# Patient Record
Sex: Male | Born: 1965 | Race: White | Hispanic: No | Marital: Married | Smoking: Never smoker
Health system: Southern US, Community
[De-identification: ages and names within clinical notes are randomized; demographics above are authoritative.]

## PROBLEM LIST (undated history)

## (undated) DIAGNOSIS — E7800 Pure hypercholesterolemia, unspecified: Secondary | ICD-10-CM

## (undated) DIAGNOSIS — E079 Disorder of thyroid, unspecified: Secondary | ICD-10-CM

---

## 2010-06-30 ENCOUNTER — Emergency Department (HOSPITAL_COMMUNITY)
Admission: EM | Admit: 2010-06-30 | Discharge: 2010-06-30 | Disposition: A | Discharge status: Home / Self Care | Payer: BC Managed Care – PPO | Attending: Emergency Medicine | Admitting: Emergency Medicine

## 2010-06-30 DIAGNOSIS — E785 Hyperlipidemia, unspecified: Secondary | ICD-10-CM | POA: Insufficient documentation

## 2010-06-30 DIAGNOSIS — Z79899 Other long term (current) drug therapy: Secondary | ICD-10-CM | POA: Insufficient documentation

## 2010-06-30 DIAGNOSIS — G500 Trigeminal neuralgia: Secondary | ICD-10-CM | POA: Insufficient documentation

## 2010-06-30 DIAGNOSIS — E039 Hypothyroidism, unspecified: Secondary | ICD-10-CM | POA: Insufficient documentation

## 2010-06-30 DIAGNOSIS — R51 Headache: Secondary | ICD-10-CM | POA: Insufficient documentation

## 2010-07-07 ENCOUNTER — Other Ambulatory Visit: Payer: Self-pay | Admitting: Family Medicine

## 2014-04-28 ENCOUNTER — Emergency Department (HOSPITAL_COMMUNITY)
Admission: EM | Admit: 2014-04-28 | Discharge: 2014-04-28 | Disposition: A | Discharge status: Home / Self Care | Payer: No Typology Code available for payment source | Attending: Emergency Medicine | Admitting: Emergency Medicine

## 2014-04-28 ENCOUNTER — Emergency Department (HOSPITAL_COMMUNITY): Payer: No Typology Code available for payment source

## 2014-04-28 ENCOUNTER — Encounter (HOSPITAL_COMMUNITY): Payer: Self-pay | Admitting: Emergency Medicine

## 2014-04-28 DIAGNOSIS — Y9389 Activity, other specified: Secondary | ICD-10-CM | POA: Insufficient documentation

## 2014-04-28 DIAGNOSIS — S0181XA Laceration without foreign body of other part of head, initial encounter: Secondary | ICD-10-CM | POA: Insufficient documentation

## 2014-04-28 DIAGNOSIS — S6991XA Unspecified injury of right wrist, hand and finger(s), initial encounter: Secondary | ICD-10-CM | POA: Diagnosis present

## 2014-04-28 DIAGNOSIS — S6391XA Sprain of unspecified part of right wrist and hand, initial encounter: Secondary | ICD-10-CM

## 2014-04-28 DIAGNOSIS — Z8639 Personal history of other endocrine, nutritional and metabolic disease: Secondary | ICD-10-CM | POA: Diagnosis not present

## 2014-04-28 DIAGNOSIS — Y92410 Unspecified street and highway as the place of occurrence of the external cause: Secondary | ICD-10-CM | POA: Insufficient documentation

## 2014-04-28 DIAGNOSIS — S134XXA Sprain of ligaments of cervical spine, initial encounter: Secondary | ICD-10-CM | POA: Diagnosis not present

## 2014-04-28 DIAGNOSIS — S43401A Unspecified sprain of right shoulder joint, initial encounter: Secondary | ICD-10-CM

## 2014-04-28 DIAGNOSIS — Y998 Other external cause status: Secondary | ICD-10-CM | POA: Diagnosis not present

## 2014-04-28 HISTORY — DX: Disorder of thyroid, unspecified: E07.9

## 2014-04-28 HISTORY — DX: Pure hypercholesterolemia, unspecified: E78.00

## 2014-04-28 MED ORDER — IBUPROFEN 800 MG PO TABS: 800.0000 mg | ORAL_TABLET | Freq: Three times a day (TID) | ORAL | Status: AC

## 2014-04-28 MED ORDER — METHOCARBAMOL 500 MG PO TABS: 500.0000 mg | ORAL_TABLET | Freq: Two times a day (BID) | ORAL | Status: AC

## 2014-04-28 MED ORDER — HYDROCODONE-ACETAMINOPHEN 5-325 MG PO TABS: 1.0000 | ORAL_TABLET | Freq: Four times a day (QID) | ORAL | Status: AC | PRN

## 2014-04-28 MED ORDER — OXYCODONE-ACETAMINOPHEN 5-325 MG PO TABS
1.0000 | ORAL_TABLET | Freq: Once | ORAL | Status: AC
  Administered 2014-04-28: 1 via ORAL
  Filled 2014-04-28: qty 1

## 2014-04-28 MED ORDER — SODIUM CHLORIDE 0.9 % IV BOLUS (SEPSIS)
1000.0000 mL | Freq: Once | INTRAVENOUS | Status: AC
  Administered 2014-04-28: 1000 mL via INTRAVENOUS

## 2014-04-28 MED ORDER — ONDANSETRON HCL 4 MG/2ML IJ SOLN
4.0000 mg | Freq: Once | INTRAMUSCULAR | Status: AC
  Administered 2014-04-28: 4 mg via INTRAVENOUS
  Filled 2014-04-28: qty 2
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
## 2014-04-28 NOTE — ED Notes (Signed)
Pt has a ride home.  

## 2014-04-28 NOTE — Discharge Instructions (Signed)
Colises de veculos motorizados Academic librarian)  comum ter mltiplos ferimentos e dores musculares aps uma coliso de veculo motorizados (CVM). Eles tendem a ser piores nas primeiras 24 horas. Voc pode ter a maior parte de rigidez e sensibilidade nas primeiras horas. Voc tambm pode sentir-se pior quando ao acordar na primeira manh aps a coliso. Depois deste ponto, em geral voc comea a melhorar a cada dia. A velocidade na melhora frequentemente depende da gravidade da coliso, do nmero de ferimentos e do local e natureza desses ferimentos. INSTRUES PARA TRATAMENTO DOMICILIAR   Coloque gelo na rea afetada.  Coloque gelo em uma sacola plstica.  Coloque uma toalha entre a pele e a sacola.  Deixe o gelo por 15 a 20 minutos, 3 a 4 vezes por dia ou conforme orientado pelo seu mdico.  Beba lquidos em quantidade suficiente para manter sua urina clara ou com cor amarelo plida. No ingira bebidas alcolicas.  Tome um banho quente uma ou duas vezes por dia. Isso ir aumentar o fluxo sanguneo para os msculos doloridos.  Voc pode retornar a suas atividades conforme orientado pelo seu mdico. Tome cuidado ao carregar peso, pois isso pode agravar a dor no pescoo e nas costas.  Somente tome medicamentos de venda livre ou prescritos contra dor, desconforto ou febre conforme orientado pelo seu mdico. No use aspirina. Ela pode aumentar a contuso e o sangramento. PROCURE UM MDICO IMEDIATAMENTE SE:  Tiver dormncia, formigamento ou fraqueza nos braos ou pernas.  Tiver dor de cabea intensa no aliviada com medicamentos.  Tiver dor intensa no pescoo, especialmente sensibilidade no meio da parte de trs do pescoo.  Tiver alteraes no controle intestinal ou urinrio.  Tiver dor crescente em qualquer rea do corpo.  Tiver falta de ar, tontura, vertigem ou desmaiar.  Tiver dor no peito.  Sentir enjoos (nuseas), vomitar (vmitos) ou sudorese.  Sentir desconforto  abdominal crescente.  Houver sangue na urina, fezes ou vmito.  Tiver dores nos ombros (reas nas juntas dos ombros).  Sentir que seus sintomas pioraram. CERTIFIQUE-SE DE:   Compreender estas instrues.  Observar o seu estado de sade.  Procurar um mdico imediatamente se no se sentir bem ou piorar. Document Released: 12/06/2008 Document Revised: 06/26/2013 [facility] Patient Information 2015 [place], [place]. This information is not intended to replace advice given to you by your health care provider. Make sure you discuss any questions you have with your health care provider.  Deslocamento cervical (Cervical Sprain) Um deslocamento cervical  uma leso no pescoo na qual os tecidos fibrosos fortes (ligamentos) que conectam os ossos do pescoo alongam-se at romper-se. Deslocamentos cervicais podem variar de leves a graves. Os deslocamentos cervicais podem fazer com que a vrtebra do pescoo fique instvel. Isto pode levar a um dano na medula espinhal e pode resultar em problemas srios do sistema nervoso. O perodo de tempo necessrio para que um deslocamento cervical melhore depende da causa e Systems analyst. A maioria dos deslocamentos cervicais so curados de 1 a 3 semanas. CAUSAS  Deslocamentos cervicais severos podem ser causados por:   Leses por esportes de contato (como futebol, rgbi, lutas, hquei, automobilismo, ginstica, mergulho, artes [employer] boxe).  Colises de veculos motorizados.  Leso cervical "efeito chicote".  uma leso pelo movimento de deslocamento repentino para frente e para trs da cabea e pescoo.  Quedas. Deslocamentos cervicais leves podem ser causados por:   Estar em posies desajeitadas, como segurar um telefone entre a orelha e o ombro.  Sentar em [employer] cadeira  que no oferece apoio apropriado.  Trabalhar numa estao de computador mal projetada.  Olhar para cima ou para baixo durante longos perodos de tempo. SINTOMAS   Dor,  inflamao, ou uma sensao de [place] na parte da frente, de trs ou laterais do pescoo. Esse desconforto pode se desenvolver imediatamente aps a leso ou lentamente, 24 horas ou mais aps a leso.  Dor ou sensibilidade diretamente no meio da parte de trs do pescoo.  Dor no ombro ou na parte superior [address]  Capacidade limitada de mover o pescoo.  Dor de cabea.  Tontura.  Garnette Czech, ou formigamento nas mos ou braos.  Espasmos musculares.  Dificuldade para Arts development officer de sensibilidade e inchao do pescoo. DIAGNSTICO  Na maioria das vezes, seu mdico pode diagnosticar um deslocamento cervical tomando seu histrico e realizando um exame fsico. Seu mdico perguntar sobre leses prvias e quaisquer problemas conhecidos no pescoo, como artrite no pescoo. Podem ser feitas radiografias para descobrir se h quaisquer outros problemas, como com os ossos do pescoo. Outros exames, como uma tomografia computadorizada (TC) ou uma ressonncia magntica (RM) tambm podem ser necessrios.  TRATAMENTO  O tratamento depende da gravidade do deslocamento cervical. Deslocamentos cervicais leves podem ser tratados com repouso, mantendo o pescoo no lugar(imobilizao) e com medicamentos para a dor. Deslocamentos cervicais graves so imobilizados imediatamente. O tratamento adicional  realizado para auxiliar com a dor, os espasmos musculares e outros sintomas e podem incluir:  Medicamentos, como aliviadores da dor, anestsicos, ou relaxantes musculares.  Fisioterapia. Isto pode incluir exerccios de alongamento, fortalecimento e orientao de postura. Exerccios e melhoria da postura podem ajudar a estabilizar o pescoo, fortalecer os msculos e ajudar a fazer com que os sintomas no retornem. INSTRUES PARA TRATAMENTO DOMICILIAR   Coloque gelo na rea afetada.  Coloque gelo em uma sacola plstica.  Coloque uma toalha entre a pele e a sacola.  Deixe o gelo por  15 a 20 minutos, de 3 a 4 vezes ao dia.  Se a leso for grave, voc pode receber um colar cervical para vestir. Um colar cervical  um colar de duas peas projetado para impedir a movimentao do pescoo enquanto ele  curado.  No remova o colar a menos que seja orientado pelo mdico.  Se tiver cabelos longos, mantenha-os fora do colar.  Consulte o mdico antes de fazer ajustes no colar. Ajustes mnimos podem ser necessrios ao longo do tempo para melhorar o conforto e reduzir a presso no queixo ou na parte posterior da cabea.  Sefor autorizado a remover o colar para limpeza ou banho, siga as orientaes do mdico [employer] faz-lo com segurana.  Mantenha seu colar limpo, lavando-o com sabo leve e gua e secando-o completamente. Se o colar recebido tiver almofadas removveis, remova-as a cada 1 ou 2 dias e lave-as  mo com gua e sabo. Deixe-as secar ao ar livre. Elas devem estar completamente secos antes de serem usadas no colar.  Se for autorizado a remover o colar para limpeza e banho, lave e seque a pele do pescoo. Verifique se h irritao ou feridas na pele. Se vir alguma delas, conte ao mdico.  No dirija enquanto usar o colar.  Somente tome medicamentos vendidos com ou sem receita mdica para dor, desconforto ou febre, conforme indicao do seu mdico.  Comparea a todas as consultas de acompanhamento, conforme orientado pelo mdico.  Comparea a todas as consultas fisioterapia, conforme orientado pelo mdico.  Faa quaisquer ajustes necessrios  sua estao de trabalho  para promover Animatoruma boa postura.  Evite posies e atividades que tornem seus sintomas piores.  Faa aquecimento e se alongue antes de se exercitar para ajudar a Education officer, communityprevenir problemas. PROCURE UM MDICO SE:   O medicamento no ajudar a Surveyor, mineralscontrolar sua dor.  No for possvel diminuir o medicamento contra dor ao longo do perodo planejado.  O nvel de atividade no aumentar conforme esperado. PROCURE UM  MDICO IMEDIATAMENTE SE:   Desenvolver qualquer sangramento.  Desenvolver desconforto estomacal.  Tiver sinais de uma reao alrgica ao medicamento.  Seus problemas piorarem.  Desenvolver sintomas novos, inexplicados.  Tiver dormncia, formigamento, fraqueza ou paralisia em qualquer parte do corpo. CERTIFIQUE-SE DE:   Compreender estas instrues.  Observar as suas condies.  Procurar um mdico imediatamente se no se sentir bem ou piorar. Document Released: 12/06/2008 Document Revised: 11/30/2012 [facility] Patient Information 2015 [place], [place]. This information is not intended to replace advice given to you by your health care provider. Make sure you discuss any questions you have with your health care provider.
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
## 2014-04-28 NOTE — ED Notes (Signed)
Pt able to walk around. Denies dizziness, lightheadedness.

## 2014-06-14 ENCOUNTER — Ambulatory Visit: Payer: No Typology Code available for payment source | Attending: Orthopaedic Surgery | Admitting: Physical Therapy

## 2014-06-14 DIAGNOSIS — M542 Cervicalgia: Secondary | ICD-10-CM | POA: Diagnosis not present

## 2014-06-14 DIAGNOSIS — R29898 Other symptoms and signs involving the musculoskeletal system: Secondary | ICD-10-CM
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
## 2014-06-21 ENCOUNTER — Ambulatory Visit: Payer: No Typology Code available for payment source | Admitting: Rehabilitation

## 2014-06-21 DIAGNOSIS — M542 Cervicalgia: Secondary | ICD-10-CM | POA: Diagnosis not present

## 2014-06-21 DIAGNOSIS — R29898 Other symptoms and signs involving the musculoskeletal system: Secondary | ICD-10-CM

## 2014-06-21 NOTE — Therapy (Signed)
[facility] [address]  [address] [place], [place], [address] Phone: [phone]   Fax:  [phone]  Physical Therapy Treatment  Patient Details  Name: Seth CoderGuilherme Valentine MRN: [id number] Date of Birth: 02/03/1966 Referring Provider:  Eldred MangesYates, Mark C, MD  Encounter Date: 06/21/2014      PT End of Session - 06/21/14 1109    Visit Number 2   Number of Visits 12   Date for PT Re-Evaluation 07/26/14   PT Start Time 1105   PT Stop Time 1146   PT Time Calculation (min) 41 min      Past Medical History  Diagnosis Date  . High cholesterol   . Thyroid disease     No past surgical history on file.  There were no vitals filed for this visit.  Visit Diagnosis:  Neck pain  Upper extremity weakness      Subjective Assessment - 06/21/14 1107    Subjective Reports he is feeling better and better. States he only has pain/tightness towards the end of the day now. Still has weakness on Rt grip but pain on Lt hand/thumb.    Currently in Pain? Yes   Pain Score 3    Pain Location Hand   Pain Orientation Left                         [facility] Adult PT Treatment/Exercise - 06/21/14 1110    Exercises   Exercises Neck;Shoulder;Wrist   Shoulder Exercises: Standing   External Rotation Right;10 reps;Theraband   Theraband Level (Shoulder External Rotation) Level 3 (Green)   Internal Rotation Right;10 reps;Theraband  Notes some Lt UT tightness   Theraband Level (Shoulder Internal Rotation) Level 3 (Green)   Extension Both;10 reps;Theraband   Theraband Level (Shoulder Extension) Level 3 (Green)   Row Both;10 reps;Theraband   Theraband Level (Shoulder Row) Level 3 (Green)   Shoulder Exercises: ROM/Strengthening   UBE (Upper Arm Bike) Level 1.5 2 min fwd/2 min bwd   Hand Exercises for Cervical Radiculopathy   Pinch Grip pinch/rotate on (big spoon) velcro board x10 Lt only   Other Hand Exercise for Cervical Radiculopathy  Large roll pinch/rotate x2 each way   Modalities   Modalities Traction   Traction   Type of Traction Cervical   Min (lbs) 5   Max (lbs) 10-12   Hold Time 60   Rest Time 20   Time 15   Neck Exercises: Stretches   Upper Trapezius Stretch 2 reps;20 seconds  bilateral   Levator Stretch 2 reps;20 seconds  bilateral   Other Neck Stretches Rhomboid stretch x20"                PT Education - 06/21/14 1143    Education provided Yes   Education Details HEP   Person(s) Educated Patient   Methods Explanation;Demonstration;Handout   Comprehension Verbalized understanding          PT Short Term Goals - 06/14/14 1253    PT SHORT TERM GOAL #1   Title independent with HEP (06/28/14)   Time 2   Period Weeks   Status New           PT Long Term Goals - 06/14/14 1254    PT LONG TERM GOAL #1   Title independent with advanced HEP (07/26/14)   Time 6   Period Weeks   Status New   PT LONG TERM GOAL #2   Title improve R  grip strength to at least 55# for improved function and strength (07/26/14)   Time 6   Period Weeks   Status New   PT LONG TERM GOAL #3   Title improve L pinch strength to at least 20# for improved function and decreased pain (07/26/14)   Time 6   Period Weeks   Status New   PT LONG TERM GOAL #4   Title report ability to work full day without need for soft cervical collar or increase in pain (07/26/14)   Time 6   Period Weeks   Status New               Plan - 06/21/14 1145    Clinical Impression Statement Good tolerance to todays exercises, gave most as HEP. No increased pain with activities. Reported a good stretch with traction, no increased pain.    PT Next Visit Plan continue modalities (e-stim vs traction), Shoulder and grip strengthening.    Consulted and Agree with Plan of Care Patient        Problem List There are no active problems to display for this patient.   Ronney Lion, PTA 06/21/2014, 11:53 AM  [facility] [address]  [address] [place], [place], [address] Phone: [phone]   Fax:  [phone]

## 2014-06-22 ENCOUNTER — Ambulatory Visit: Payer: No Typology Code available for payment source | Admitting: Rehabilitation

## 2014-06-22 DIAGNOSIS — M542 Cervicalgia: Secondary | ICD-10-CM

## 2014-06-22 DIAGNOSIS — R29898 Other symptoms and signs involving the musculoskeletal system: Secondary | ICD-10-CM

## 2014-06-22 NOTE — Therapy (Signed)
[facility] [place] [address]  [address] [place], [place], [address] Phone: [phone]   Fax:  [phone]  Physical Therapy Treatment  Patient Details  Name: Seth CoderGuilherme Gresham MRN: [id number] Date of Birth: 10/28/1965 Referring Provider:  Eldred MangesYates, Mark C, MD  Encounter Date: 06/22/2014      PT End of Session - 06/22/14 0940    Visit Number 3   Number of Visits 12   Date for PT Re-Evaluation 07/26/14   PT Start Time 0937   PT Stop Time 1030   PT Time Calculation (min) 53 min      Past Medical History  Diagnosis Date  . High cholesterol   . Thyroid disease     No past surgical history on file.  There were no vitals filed for this visit.  Visit Diagnosis:  Neck pain  Upper extremity weakness      Subjective Assessment - 06/22/14 0939    Subjective Reports he barely had any pain yesterday. States that whenever he felt a little stressed or tight he would perform his HEP and that would relieve the symptoms. Feels that the combination of exercises and traction seem to be helping.    Currently in Pain? No/denies           [facility] - 06/22/14 0941    Shoulder Exercises: ROM/Strengthening   UBE (Upper Arm Bike) Level 2.0 2 min fwd/2 min bwd   Other ROM/Strengthening Exercises D2 with green TB x10 each    Shoulder Exercises: Stretch   Corner Stretch 3 reps;20 seconds   Hand Exercises for Cervical Radiculopathy   Pinch Grip pinch/rotate on (big spoon) velcro board x10 Lt only   Other Hand Exercise for Cervical Radiculopathy Long handle roll pinch/rotate x2 each way, bilateral   Wrist Exercises   Bar Weights/Barbell (Forearm Supination) 3 lbs  x10, noted pain along lateral wrist with full motion   Bar Weights/Barbell (Forearm Pronation) 3 lbs  x10   Other wrist exercises Wrist extension stretch 2x20" bilateral   Modalities   Modalities Traction   Traction   Type of Traction Cervical   Min  (lbs) 7   Max (lbs) 14   Hold Time 60   Rest Time 20   Time 15                PT Education - 06/21/14 1143    Education provided Yes   Education Details HEP   Person(s) Educated Patient   Methods Explanation;Demonstration;Handout   Comprehension Verbalized understanding          PT Short Term Goals - 06/22/14 1016    PT SHORT TERM GOAL #1   Title independent with HEP (06/28/14)   Status Achieved           PT Long Term Goals - 06/22/14 1016    PT LONG TERM GOAL #1   Title independent with advanced HEP (07/26/14)   Status On-going   PT LONG TERM GOAL #2   Title improve R grip strength to at least 55# for improved function and strength (07/26/14)   Status On-going   PT LONG TERM GOAL #3   Title improve L pinch strength to at least 20# for improved function and decreased pain (07/26/14)   Status On-going   PT LONG TERM GOAL #4   Title report ability to work full day without need for soft cervical collar or increase in pain (07/26/14)   Status On-going  Plan - 06/22/14 1014    Clinical Impression Statement Good tolernace to wrist exercises with pain during flexion and supination exercises.    PT Next Visit Plan continue modalities (e-stim vs traction), Shoulder and grip strengthening.    Consulted and Agree with Plan of Care Patient        Problem List There are no active problems to display for this patient.   Ronney Lion, PTA 06/22/2014, 10:16 AM  [facility] [address]  [address] [place], [place], [address] Phone: [phone]   Fax:  [phone]

## 2014-06-25 ENCOUNTER — Ambulatory Visit: Payer: No Typology Code available for payment source | Attending: Orthopaedic Surgery | Admitting: Rehabilitation

## 2014-06-25 DIAGNOSIS — M542 Cervicalgia: Secondary | ICD-10-CM | POA: Diagnosis not present

## 2014-06-25 DIAGNOSIS — R29898 Other symptoms and signs involving the musculoskeletal system: Secondary | ICD-10-CM

## 2014-06-25 NOTE — Therapy (Signed)
[facility] [address]  [address] [place], [place], [address] Phone: [phone]   Fax:  [phone]  Physical Therapy Treatment  Patient Details  Name: Seth Stanley MRN: [id number] Date of Birth: 15-Dec-1965 Referring Provider:  Eldred Manges, MD  Encounter Date: 06/25/2014      PT End of Session - 06/25/14 1545    Visit Number 4   Number of Visits 12   Date for PT Re-Evaluation 07/26/14   PT Start Time 1543  pt running late   PT Stop Time 1630   PT Time Calculation (min) 47 min   Activity Tolerance Patient tolerated treatment well   Behavior During Therapy [facility] for tasks assessed/performed      Past Medical History  Diagnosis Date  . High cholesterol   . Thyroid disease     No past surgical history on file.  There were no vitals filed for this visit.  Visit Diagnosis:  Neck pain  Upper extremity weakness      Subjective Assessment - 06/25/14 1544    Subjective Reports he had some pain toward the end of Friday but the rest of the weekend was fine. He was able to push his mower and do some yardwork without pain. No pain so far today.    Currently in Pain? No/denies            [facility] PT Assessment - 06/25/14 0001    Strength   Strength Assessment Site Shoulder   Right Shoulder Flexion 5/5   Right Shoulder ABduction 4+/5   Right Shoulder Internal Rotation 5/5   Right Shoulder External Rotation 4+/5   Left Shoulder Flexion 5/5   Left Shoulder ABduction 5/5   Left Shoulder Internal Rotation 5/5   Left Shoulder External Rotation 4+/5   Right Hand Lateral Pinch --  24, 26   Left Hand Lateral Pinch --  22, 23                     [facility] Adult PT Treatment/Exercise - 06/25/14 1546    Shoulder Exercises: ROM/Strengthening   UBE (Upper Arm Bike) Level 2.0 2 min fwd/2 min bwd   Wrist Exercises   Bar Weights/Barbell (Forearm Supination) 3 lbs  x12 No pain   Bar Weights/Barbell (Forearm  Pronation) 3 lbs  x12 No pain   Other wrist exercises Lt Wrist extension 3# x10 (pt reports weakness and pain), Lt Wrist flexion 3# x10 (pain at end of range)   Other wrist exercises Lt Wrist extension stretch 2x20", Lt Wrist flexion stretch 2x20"   Modalities   Modalities Ultrasound;Traction   Ultrasound   Ultrasound Location Lt wrist extensor mass/most tender point   Ultrasound Parameters 3.53mHz, 50% duty cycle, 0.8 w/cm2, x8'   Ultrasound Goals Pain   Traction   Type of Traction Cervical   Min (lbs) 7   Max (lbs) 14   Hold Time 60   Rest Time 20   Time 15                  PT Short Term Goals - 06/22/14 1016    PT SHORT TERM GOAL #1   Title independent with HEP (06/28/14)   Status Achieved           PT Long Term Goals - 06/22/14 1016    PT LONG TERM GOAL #1   Title independent with advanced HEP (07/26/14)   Status On-going   PT LONG TERM GOAL #  2   Title improve R grip strength to at least 55# for improved function and strength (07/26/14)   Status On-going   PT LONG TERM GOAL #3   Title improve L pinch strength to at least 20# for improved function and decreased pain (07/26/14)   Status On-going   PT LONG TERM GOAL #4   Title report ability to work full day without need for soft cervical collar or increase in pain (07/26/14)   Status On-going               Plan - 06/25/14 1619    Clinical Impression Statement Good tolerance with wrist exerciese with less pain noted today. Palpable tenderness with Lt wrist extensor mass so tried ultrasound today. Focus was mainly on wrist today due to shoulder and neck feeling fine.    PT Next Visit Plan See how ultrasound did, continue posture, wrist, and shoulder exercises   Consulted and Agree with Plan of Care Patient        Problem List There are no active problems to display for this patient.   Ronney LionDUCKER, Mukesh Kornegay J, PTA 06/25/2014, 4:57 PM  [facility] [place] [address]  [address] [place], [place], [address] Phone: [phone]   Fax:  [phone]

## 2014-06-26 ENCOUNTER — Ambulatory Visit: Payer: No Typology Code available for payment source | Admitting: Rehabilitation

## 2014-06-28 ENCOUNTER — Ambulatory Visit: Payer: No Typology Code available for payment source | Admitting: Physical Therapy

## 2014-06-28 DIAGNOSIS — M542 Cervicalgia: Secondary | ICD-10-CM | POA: Diagnosis not present

## 2014-06-28 DIAGNOSIS — R29898 Other symptoms and signs involving the musculoskeletal system: Secondary | ICD-10-CM

## 2014-06-28 NOTE — Therapy (Signed)
[facility] [place] [address]  [address] [place], [place], [address] Phone: [phone]   Fax:  [phone]  Physical Therapy Treatment  Patient Details  Name: Seth Stanley MRN: [id number] Date of Birth: 03/31/1965 Referring Provider:  Eldred MangesYates, Mark C, MD  Encounter Date: 06/28/2014      PT End of Session - 06/28/14 0928    Visit Number 5   Number of Visits 12   Date for PT Re-Evaluation 07/26/14   PT Start Time 0848   PT Stop Time 0942   PT Time Calculation (min) 54 min   Activity Tolerance Patient tolerated treatment well   Behavior During Therapy [facility] for tasks assessed/performed      Past Medical History  Diagnosis Date  . High cholesterol   . Thyroid disease     No past surgical history on file.  There were no vitals filed for this visit.  Visit Diagnosis:  Neck pain  Upper extremity weakness      Subjective Assessment - 06/28/14 0850    Subjective Feels like US and traction are really helping.  Has minimal pain in neck, not wearing collar at all for past 2 weeks.   Currently in Pain? Yes   Pain Score 1    Pain Location Neck   Pain Orientation Left   Pain Descriptors / Indicators Constant;Tiring;Tightness;Discomfort   Pain Onset More than a month ago   Pain Frequency Intermittent   Aggravating Factors  working long hours   Pain Relieving Factors stretching, raising arms            [facility] PT Assessment - 06/28/14 0856    Strength   Grip (lbs) --  71, 75, 68   Grip (lbs) --  72, 66, 75                     [facility] Adult PT Treatment/Exercise - 06/28/14 0853    Shoulder Exercises: ROM/Strengthening   UBE (Upper Arm Bike) Level 2.5 x 6 min; 3 min forward/3 min backward   Wrist Exercises   Bar Weights/Barbell (Forearm Supination) 3 lbs  2x10   Bar Weights/Barbell (Forearm Pronation) 3 lbs  2x10   Other wrist exercises L wrist ext 3#, 2x10; L wrist flex 3# 2x10   Other wrist  exercises finger flexion/ext with rubberbands x20 on L; velcro board long handle R only x 4   Modalities   Modalities Ultrasound;Traction   Ultrasound   Ultrasound Location L wrist extensors   Ultrasound Parameters 3.3 mHz, 50% DC, 0.8 w/cm2 x 8 min   Ultrasound Goals Pain   Traction   Type of Traction Cervical   Min (lbs) 8   Max (lbs) 15   Hold Time 60   Rest Time 20   Time 15                  PT Short Term Goals - 06/22/14 1016    PT SHORT TERM GOAL #1   Title independent with HEP (06/28/14)   Status Achieved           PT Long Term Goals - 06/22/14 1016    PT LONG TERM GOAL #1   Title independent with advanced HEP (07/26/14)   Status On-going   PT LONG TERM GOAL #2   Title improve R grip strength to at least 55# for improved function and strength (07/26/14)   Status On-going   PT LONG TERM GOAL #3   Title  improve L pinch strength to at least 20# for improved function and decreased pain (07/26/14)   Status On-going   PT LONG TERM GOAL #4   Title report ability to work full day without need for soft cervical collar or increase in pain (07/26/14)   Status On-going               Plan - 06/28/14 [id number]    PT Next Visit Plan continue u/s and traction; wrist, grip and posture exercises   Consulted and Agree with Plan of Care Patient        Problem List There are no active problems to display for this patient.  Clarita CraneStephanie F Mathis Cashman, PT, DPT 06/28/2014 9:51 AM  [facility] [place] [address]  [address] [place], [place], [address] Phone: [phone]   Fax:  [phone]

## 2014-07-03 ENCOUNTER — Ambulatory Visit: Payer: No Typology Code available for payment source | Admitting: Rehabilitation

## 2014-07-03 DIAGNOSIS — M542 Cervicalgia: Secondary | ICD-10-CM

## 2014-07-03 DIAGNOSIS — R29898 Other symptoms and signs involving the musculoskeletal system: Secondary | ICD-10-CM

## 2014-07-03 NOTE — Therapy (Signed)
[facility] [place] [address]  [address] [place], [place], [address] Phone: [phone]   Fax:  [phone]  Physical Therapy Treatment  Patient Details  Name: Seth Stanley MRN: [id number] Date of Birth: 12/03/1965 Referring Provider:  Eldred MangesYates, Mark C, MD  Encounter Date: 07/03/2014      PT End of Session - 07/03/14 1026    Visit Number 6   Number of Visits 12   Date for PT Re-Evaluation 07/26/14   PT Start Time 1022   PT Stop Time 1116   PT Time Calculation (min) 54 min      Past Medical History  Diagnosis Date  . High cholesterol   . Thyroid disease     No past surgical history on file.  There were no vitals filed for this visit.  Visit Diagnosis:  Upper extremity weakness  Neck pain      Subjective Assessment - 07/03/14 1025    Subjective Noted some neck pain over the weekend, always towards the end of the day. Lt wrist is his main complaint now. Reports increased pain with pinch grip and lift combo, still along wrist extensor mass.   Currently in Pain? Yes   Pain Score --  1-2/10   Pain Location Wrist   Pain Orientation Left                         [facility] Adult PT Treatment/Exercise - 07/03/14 1026    Shoulder Exercises: ROM/Strengthening   UBE (Upper Arm Bike) Level 2.5 x 6 min; 3 min forward/3 min backward   Hand Exercises for Cervical Radiculopathy   Pinch Grip pinch on orange grip ball x10 then x10 with thumb and each finger   Modalities   Modalities Ultrasound;Electrical Stimulation;Moist Heat   Moist Heat Therapy   Number Minutes Moist Heat 15 Minutes   Moist Heat Location Shoulder  Lt   Electrical Stimulation   Electrical Stimulation Location Lt neck/upper throacic   Electrical Stimulation Parameters IFC   Electrical Stimulation Goals Pain   Ultrasound   Ultrasound Location Lt wrist extensors   Ultrasound Parameters 3.313mHz, 50% DC, 0.8w/cm2 x8   Ultrasound Goals Pain   Manual Therapy   Manual Therapy Massage   Massage STM/TPR to Lt wrist extensors                  PT Short Term Goals - 06/22/14 1016    PT SHORT TERM GOAL #1   Title independent with HEP (06/28/14)   Status Achieved           PT Long Term Goals - 06/22/14 1016    PT LONG TERM GOAL #1   Title independent with advanced HEP (07/26/14)   Status On-going   PT LONG TERM GOAL #2   Title improve R grip strength to at least 55# for improved function and strength (07/26/14)   Status On-going   PT LONG TERM GOAL #3   Title improve L pinch strength to at least 20# for improved function and decreased pain (07/26/14)   Status On-going   PT LONG TERM GOAL #4   Title report ability to work full day without need for soft cervical collar or increase in pain (07/26/14)   Status On-going               Plan - 07/03/14 1102    Clinical Impression Statement Performed more STM/TPR today due to tenderness to palpation along Lt  wrist extensors. Good tolerance   PT Next Visit Plan continue u/s and traction/e-stim; wrist, grip and posture exercises   Consulted and Agree with Plan of Care Patient        Problem List There are no active problems to display for this patient.   Ronney LionDUCKER, Jevaun Strick J, PTA 07/03/2014, 11:03 AM  [facility] [place] [address]  [address] [place], [place], [address] Phone: [phone]   Fax:  [phone]

## 2014-07-05 ENCOUNTER — Ambulatory Visit: Payer: No Typology Code available for payment source | Admitting: Physical Therapy

## 2014-07-05 DIAGNOSIS — M542 Cervicalgia: Secondary | ICD-10-CM

## 2014-07-05 DIAGNOSIS — R29898 Other symptoms and signs involving the musculoskeletal system: Secondary | ICD-10-CM

## 2014-07-05 NOTE — Therapy (Signed)
[facility] [place] [address]  [address] [place], [place], [address] Phone: [phone]   Fax:  [phone]  Physical Therapy Treatment  Patient Details  Name: Seth CoderGuilherme Stanley MRN: [id number] Date of Birth: 09/19/1965 Referring Provider:  Eldred MangesYates, Mark C, MD  Encounter Date: 07/05/2014      PT End of Session - 07/05/14 1436    Visit Number 7   Number of Visits 12   Date for PT Re-Evaluation 07/26/14   PT Start Time 1415  pt arrived late   PT Stop Time 1448   PT Time Calculation (min) 33 min   Activity Tolerance Patient tolerated treatment well   Behavior During Therapy [facility] for tasks assessed/performed      Past Medical History  Diagnosis Date  . High cholesterol   . Thyroid disease     No past surgical history on file.  There were no vitals filed for this visit.  Visit Diagnosis:  Upper extremity weakness  Neck pain      Subjective Assessment - 07/05/14 1434    Subjective had some neck pain after last session but much improved today.  overall improving.   Limitations Sitting;House hold activities   How long can you sit comfortably? by end of work day   Currently in Pain? Yes   Pain Score --  did not rate   Pain Location Elbow  and neck   Pain Orientation Left                         [facility] Adult PT Treatment/Exercise - 07/05/14 1435    Modalities   Modalities Ultrasound;Traction   Ultrasound   Ultrasound Location L wrist extensors   Ultrasound Parameters 1.0 mHz; 50% DC, 1.0 w/cm2 x 8 min   Ultrasound Goals Pain   Traction   Type of Traction Cervical   Min (lbs) 12   Max (lbs) 18   Hold Time 60   Rest Time 20   Time 15   Manual Therapy   Manual Therapy Massage   Massage STM/TPR to Lt wrist extensors                  PT Short Term Goals - 06/22/14 1016    PT SHORT TERM GOAL #1   Title independent with HEP (06/28/14)   Status Achieved           PT Long Term  Goals - 06/22/14 1016    PT LONG TERM GOAL #1   Title independent with advanced HEP (07/26/14)   Status On-going   PT LONG TERM GOAL #2   Title improve R grip strength to at least 55# for improved function and strength (07/26/14)   Status On-going   PT LONG TERM GOAL #3   Title improve L pinch strength to at least 20# for improved function and decreased pain (07/26/14)   Status On-going   PT LONG TERM GOAL #4   Title report ability to work full day without need for soft cervical collar or increase in pain (07/26/14)   Status On-going               Plan - 07/05/14 1437    PT Next Visit Plan continue u/s and traction/e-stim; wrist, grip and posture exercises   Consulted and Agree with Plan of Care Patient        Problem List There are no active problems to display for this patient.  Judeth CornfieldStephanie  Maudry MayhewF Giuseppina Quinones, PT, DPT 07/05/2014 2:56 PM  [facility] [place] [address]  [address] [place], [place], [address] Phone: [phone]   Fax:  [phone]

## 2014-07-10 ENCOUNTER — Ambulatory Visit: Payer: No Typology Code available for payment source | Admitting: Physical Therapy

## 2014-07-10 DIAGNOSIS — M542 Cervicalgia: Secondary | ICD-10-CM | POA: Diagnosis not present

## 2014-07-10 DIAGNOSIS — R29898 Other symptoms and signs involving the musculoskeletal system: Secondary | ICD-10-CM

## 2014-07-10 NOTE — Patient Instructions (Signed)
Wrist Extensor Stretch   Keeping elbow straight, grasp left hand and slowly bend wrist forward until stretch is felt. Hold _30___ seconds. Relax. Repeat __3__ times per set. Do __1__ sets per session. Do __2-3__ sessions per day.  Copyright  VHI. All rights reserved.   Extension (Active With Finger Flexion)  With 3 lb. weight, bend hand back at wrist. Hold __1-2__ seconds. Repeat __2x15__ times. Do __1-2__ sessions per day.  Copyright  VHI. All rights reserved.   AROM: Wrist Flexion   With left palm up, bend wrist up.  Use 3 lb weight. Repeat __15__ times per set. Do __2__ sets per session. Do _1-2___ sessions per day.  Copyright  VHI. All rights reserved.   Seth Stanley, PT, DPT 07/10/2014 9:24 AM  [facility] [place] [address] [place], [place] [address]  [phone] (office) [phone] (fax)

## 2014-07-10 NOTE — Therapy (Signed)
[facility] [place] [address]  [address] [place], [place], [address] Phone: [phone]   Fax:  [phone]  Physical Therapy Treatment  Patient Details  Name: Seth Stanley MRN: [id number] Date of Birth: 09/06/1965 Referring Provider:  Eldred MangesYates, Mark C, MD  Encounter Date: 07/10/2014      PT End of Session - 07/10/14 0925    Visit Number 8   Number of Visits 12   Date for PT Re-Evaluation 07/26/14   PT Start Time 0848   PT Stop Time 0932   PT Time Calculation (min) 44 min   Activity Tolerance Patient tolerated treatment well   Behavior During Therapy [facility] for tasks assessed/performed      Past Medical History  Diagnosis Date  . High cholesterol   . Thyroid disease     No past surgical history on file.  There were no vitals filed for this visit.  Visit Diagnosis:  Upper extremity weakness - Plan: PT plan of care cert/re-cert  Neck pain - Plan: PT plan of care cert/re-cert      Subjective Assessment - 07/10/14 0854    Subjective neck feels a lot better; still having pain in L thumb/wrist extensors   Currently in Pain? Yes   Pain Score 3    Pain Location Elbow   Pain Orientation Left   Pain Descriptors / Indicators Constant;Discomfort;Tightness   Pain Onset More than a month ago   Pain Frequency Intermittent                         [facility] Adult PT Treatment/Exercise - 07/10/14 0857    Neck Exercises: Machines for Strengthening   UBE (Upper Arm Bike) Level 3 x 6 min; 3 min forward/3 min backward   Wrist Exercises   Other wrist exercises wrist extension stretch 3x30 sec L, wrist flex/ext 3# L 2x15   Other wrist exercises pronation/supination 3# L 2x15   Traction   Type of Traction Cervical   Min (lbs) 15   Max (lbs) 20   Hold Time 60   Rest Time 20   Time 15                PT Education - 07/10/14 0925    Education provided Yes   Education Details HEP   Person(s) Educated  Patient   Methods Explanation;Demonstration;Handout   Comprehension Verbalized understanding          PT Short Term Goals - 06/22/14 1016    PT SHORT TERM GOAL #1   Title independent with HEP (06/28/14)   Status Achieved           PT Long Term Goals - 06/22/14 1016    PT LONG TERM GOAL #1   Title independent with advanced HEP (07/26/14)   Status On-going   PT LONG TERM GOAL #2   Title improve R grip strength to at least 55# for improved function and strength (07/26/14)   Status On-going   PT LONG TERM GOAL #3   Title improve L pinch strength to at least 20# for improved function and decreased pain (07/26/14)   Status On-going   PT LONG TERM GOAL #4   Title report ability to work full day without need for soft cervical collar or increase in pain (07/26/14)   Status On-going               Plan - 07/10/14 0925    Clinical Impression Statement  Neck pain continues to improve; still with residual wrist extensor muscle tightness and pain.  Will request signed order for ionto.   PT Next Visit Plan continue u/s and traction/e-stim; wrist, grip and posture exercises; ionto if order signed (sent electronically)   Consulted and Agree with Plan of Care Patient        Problem List There are no active problems to display for this patient.  Clarita CraneStephanie F Orianna Biskup, PT, DPT 07/10/2014 9:40 AM  [facility] [place] [address]  [address] [place], [place], [address] Phone: [phone]   Fax:  [phone]

## 2014-07-12 ENCOUNTER — Ambulatory Visit: Payer: No Typology Code available for payment source | Admitting: Rehabilitation

## 2014-07-12 DIAGNOSIS — M542 Cervicalgia: Secondary | ICD-10-CM | POA: Diagnosis not present

## 2014-07-12 DIAGNOSIS — R29898 Other symptoms and signs involving the musculoskeletal system: Secondary | ICD-10-CM

## 2014-07-12 NOTE — Therapy (Signed)
[place] [address]  [place], [place], [address] Phone: [phone]   Fax:  [phone]  Physical Therapy Treatment  Patient Details  Name: Seth Stanley MRN: [id number] Date of Birth: 1965-08-04 Referring Provider:  [name], MD  Encounter Date: 07/12/2014      PT End of Session - 07/12/14 1115    Visit Number 9   Number of Visits 12   Date for PT Re-Evaluation 07/26/14   PT Start Time 1109   PT Stop Time 1150   PT Time Calculation (min) 41 min      Past Medical History  Diagnosis Date  . High cholesterol   . Thyroid disease     No past surgical history on file.  There were no vitals filed for this visit.  Visit Diagnosis:  Upper extremity weakness  Neck pain      Subjective Assessment - 07/12/14 1110    Subjective Noted some increased pain yesterday for unknown reason. Pain ran from upper neck down all the way to his leg, this is the first time this has happened according to pt (pt also states that he wonders if this is related to the previous pain or something else/new). Isn't noticing wrist/thumb pain today. States he feels that his grip strength is back to normal.    Currently in Pain? Yes   Pain Score 3    Pain Location Neck  neck/scapula area   Pain Orientation Left;Upper            [facility] PT Assessment - 07/12/14 1113    Strength   Grip (lbs) R: 68.3  80, 60, 65   Right Hand Lateral Pinch --  25, 25   Grip (lbs) L: 60  80, 50, 50   Left Hand Lateral Pinch --  22, 22                     [facility] Adult PT Treatment/Exercise - 07/12/14 1113    Neck Exercises: Machines for Strengthening   UBE (Upper Arm Bike) Level 3 x 6 min; 3 min forward/3 min backward   Shoulder Exercises: Stretch   Other Shoulder Stretches Rhomboid 2x20"   Modalities   Modalities Iontophoresis;Traction   Iontophoresis   Type of Iontophoresis Dexamethasone   Location Lt wrist  extensors   Dose 1.0 ml, 80 mAmp/min   Time 4-6 hour patch   Traction   Type of Traction Cervical   Min (lbs) 15   Max (lbs) 20   Hold Time 60   Rest Time 20   Time 15   Neck Exercises: Stretches   Upper Trapezius Stretch 2 reps;20 seconds  bil   Levator Stretch 2 reps;20 seconds  bil                  PT Short Term Goals - 06/22/14 1016    PT SHORT TERM GOAL #1   Title independent with HEP (06/28/14)   Status Achieved           PT Long Term Goals - 07/12/14 1112    PT LONG TERM GOAL #1   Title independent with advanced HEP (07/26/14)   Status On-going   PT LONG TERM GOAL #2   Title improve R grip strength to at least 55# for improved function and strength (07/26/14)   Status Achieved   PT LONG TERM GOAL #3   Title improve L pinch strength to at least 20#  for improved function and decreased pain (07/26/14)   Status Achieved   PT LONG TERM GOAL #4   Title report ability to work full day without need for soft cervical collar or increase in pain (07/26/14)   Status Achieved               Plan - 07/12/14 1115    Clinical Impression Statement Pt reports he will be out of town for the next week due to work. He will be traveling to [place]. More focus on Neck today due to higher pain today. Has met all LTG's except for one.    PT Next Visit Plan See how iontophoresis went.         Problem List There are no active problems to display for this patient.   Barbette Hair, PTA 07/12/2014, 11:50 AM  [facility] [address]  [place], [place], [address] Phone: [phone]   Fax:  [phone]

## 2014-07-31 ENCOUNTER — Ambulatory Visit: Payer: No Typology Code available for payment source | Attending: Orthopaedic Surgery | Admitting: Physical Therapy

## 2014-07-31 DIAGNOSIS — M542 Cervicalgia: Secondary | ICD-10-CM | POA: Insufficient documentation

## 2014-07-31 DIAGNOSIS — R29898 Other symptoms and signs involving the musculoskeletal system: Secondary | ICD-10-CM | POA: Diagnosis present

## 2014-07-31 NOTE — Therapy (Signed)
[facility] [address]  [address] [place], [place], [address] Phone: [phone]   Fax:  [phone]  Physical Therapy Treatment  Patient Details  Name: Seth Stanley MRN: [id number] Date of Birth: [birth year] Referring Provider:  Eldred Manges, MD  Encounter Date: 07/31/2014      PT End of Session - 07/31/14 1519    Visit Number 10   Number of Visits 12   Date for PT Re-Evaluation 08/10/14  extended date due to missed weeks   PT Start Time 1448   PT Stop Time 1520   PT Time Calculation (min) 32 min   Activity Tolerance Patient tolerated treatment well   Behavior During Therapy [facility] for tasks assessed/performed      Past Medical History  Diagnosis Date  . High cholesterol   . Thyroid disease     No past surgical history on file.  There were no vitals filed for this visit.  Visit Diagnosis:  Upper extremity weakness  Neck pain      Subjective Assessment - 07/31/14 1451    Subjective Traveled to [place]; neck was completely fine no problems.  Still having some pain in left thumb/wrist extensors.   How long can you sit comfortably? by end of work day   Currently in Pain? Yes   Pain Score 4    Pain Location Hand  thumb   Pain Orientation Left   Pain Descriptors / Indicators Aching   Pain Radiating Towards elbow; wrist/thumb extensors                         [facility] Adult PT Treatment/Exercise - 07/31/14 1452    Neck Exercises: Machines for Strengthening   UBE (Upper Arm Bike) Level 3 x 6 min; 3 min forward/3 min backward   Wrist Exercises   Other wrist exercises thumb ext stretch 2x30 sec; seated ulnar deviation stretch 2x 30 sec on L; wrist extensor stretch 2x30 sec   Ultrasound   Ultrasound Location L wrist extensors   Ultrasound Parameters 1.0 mHz; 1.0w/cm2; 20% DC x 8 min   Ultrasound Goals Pain   Iontophoresis   Type of Iontophoresis Dexamethasone   Location Lt wrist extensors   Dose 1.0 ml, 80 mAmp/min   Time 4-6 hour patch                  PT Short Term Goals - 06/22/14 1016    PT SHORT TERM GOAL #1   Title independent with HEP (06/28/14)   Status Achieved           PT Long Term Goals - 07/12/14 1112    PT LONG TERM GOAL #1   Title independent with advanced HEP (07/26/14)   Status On-going   PT LONG TERM GOAL #2   Title improve R grip strength to at least 55# for improved function and strength (07/26/14)   Status Achieved   PT LONG TERM GOAL #3   Title improve L pinch strength to at least 20# for improved function and decreased pain (07/26/14)   Status Achieved   PT LONG TERM GOAL #4   Title report ability to work full day without need for soft cervical collar or increase in pain (07/26/14)   Status Achieved               Plan - 07/31/14 1520    Clinical Impression Statement Pt returns to [place] following [place] business [place] to  [place].  Pt reports limited pain during trip and only with L thumb/wrist.  No episodes of neck pain.  Anticipate pt ready for d/c next visit.   PT Next Visit Plan plan for d/c; review wrist stretches and add exercises if needed; ionto if needed   Consulted and Agree with Plan of Care Patient        Problem List There are no active problems to display for this patient.  Clarita CraneStephanie F Fatuma Dowers, PT, DPT 07/31/2014 3:23 PM  [facility] [place] [address]  [address] [place], [place], [address] Phone: [phone]   Fax:  [phone]

## 2014-08-02 ENCOUNTER — Ambulatory Visit: Payer: No Typology Code available for payment source

## 2014-08-07 ENCOUNTER — Ambulatory Visit: Payer: No Typology Code available for payment source | Admitting: Physical Therapy

## 2014-08-07 DIAGNOSIS — R29898 Other symptoms and signs involving the musculoskeletal system: Secondary | ICD-10-CM

## 2014-08-07 DIAGNOSIS — M542 Cervicalgia: Secondary | ICD-10-CM

## 2014-08-07 NOTE — Therapy (Signed)
[facility] [address]  [place], [place], [address] Phone: [phone]   Fax:  [phone]  Physical Therapy Treatment  Patient Details  Name: Seth Stanley MRN: [id number] Date of Birth: Nov 09, 1965 Referring Provider:  [name], MD  Encounter Date: 08/07/2014      PT End of Session - 08/07/14 0958    Visit Number 11   Number of Visits 12   Date for PT Re-Evaluation 08/10/14   PT Start Time 0935   PT Stop Time 0959   PT Time Calculation (min) 24 min   Activity Tolerance Patient tolerated treatment well   Behavior During Therapy [facility] for tasks assessed/performed      Past Medical History  Diagnosis Date  . High cholesterol   . Thyroid disease     No past surgical history on file.  There were no vitals filed for this visit.  Visit Diagnosis:  Upper extremity weakness  Neck pain      Subjective Assessment - 08/07/14 0938    Subjective Feels better; ready for d/c today.   Pain Score 1    Pain Location Hand  thumb   Pain Orientation Left   Pain Descriptors / Indicators Aching   Pain Onset More than a month ago   Pain Frequency Intermittent            [facility] PT Assessment - 08/07/14 1006    Observation/Other Assessments   Focus on Therapeutic Outcomes (FOTO)  82 (18% limited)                     [facility] Adult PT Treatment/Exercise - 08/07/14 0939    Neck Exercises: Machines for Strengthening   UBE (Upper Arm Bike) Level 3 x 6 min; 3 min forward/3 min backward   Wrist Exercises   Other wrist exercises thumb ext stretch 2x30 sec; seated ulnar deviation stretch 2x 30 sec on L; wrist extensor stretch 2x30 sec   Other wrist exercises radial deviation x 20 with 2#   Modalities   Modalities Iontophoresis   Iontophoresis   Type of Iontophoresis Dexamethasone   Location Lt wrist extensors   Dose 1.0 ml, 80 mAmp/min   Time 4-6 hour patch                  PT  Short Term Goals - 06/22/14 1016    PT SHORT TERM GOAL #1   Title independent with HEP (06/28/14)   Status Achieved           PT Long Term Goals - 08/07/14 0959    PT LONG TERM GOAL #1   Title independent with advanced HEP (07/26/14)   Status Achieved   PT LONG TERM GOAL #2   Title improve R grip strength to at least 55# for improved function and strength (07/26/14)   Status Achieved   PT LONG TERM GOAL #3   Title improve L pinch strength to at least 20# for improved function and decreased pain (07/26/14)   Status Achieved   PT LONG TERM GOAL #4   Title report ability to work full day without need for soft cervical collar or increase in pain (07/26/14)   Status Achieved               Plan - 08/07/14 0959    Clinical Impression Statement Pt has met all goals and is ready for d/c at this time.     PT Next Visit  Plan d/c PT today   Consulted and Agree with Plan of Care Patient        Problem List There are no active problems to display for this patient.  Seth Stanley, PT, DPT 08/07/2014 10:08 AM  [place] [place] [address]  [place], [place], [address] Phone: [phone]   Fax:  [phone]    PHYSICAL THERAPY DISCHARGE SUMMARY  Visits from Start of Care: 11  Current functional level related to goals / functional outcomes: See above; all goals met   Remaining deficits: Occasional L thumb/wrist pain.  At this time, pain is not limiting function and pt has exercises to perform.   Education / Equipment: HEP  Plan: Patient agrees to discharge.  Patient goals were met. Patient is being discharged due to meeting the stated rehab goals.  ?????    Seth Stanley, PT, DPT 08/07/2014 10:09 AM  [facility] [address], [place] [address]  [phone] (office) [phone] (fax)

## 2016-04-14 ENCOUNTER — Ambulatory Visit: Payer: BLUE CROSS/BLUE SHIELD | Attending: Specialist | Admitting: Physical Therapy

## 2016-04-14 DIAGNOSIS — M25511 Pain in right shoulder: Secondary | ICD-10-CM | POA: Diagnosis present

## 2016-04-14 DIAGNOSIS — M6281 Muscle weakness (generalized): Secondary | ICD-10-CM | POA: Diagnosis present
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
## 2016-04-16 ENCOUNTER — Ambulatory Visit: Payer: BLUE CROSS/BLUE SHIELD

## 2016-04-16 DIAGNOSIS — M25511 Pain in right shoulder: Secondary | ICD-10-CM

## 2016-04-16 DIAGNOSIS — M6281 Muscle weakness (generalized): Secondary | ICD-10-CM

## 2016-04-16 NOTE — Therapy (Signed)
[facility] [address]  [address] [place], [place], [address] Phone: [phone]   Fax:  [phone]  Physical Therapy Treatment  Patient Details  Name: Seth CoderGuilherme Stanley MRN: [id number] Date of Birth: 02/16/1966 Referring Provider: Dr. Valma CavaAndrew Collins  Encounter Date: 04/16/2016      PT End of Session - 04/16/16 1112    Visit Number 2   Number of Visits 8   Date for PT Re-Evaluation 05/15/16   PT Start Time 1104   PT Stop Time 1148   PT Time Calculation (min) 44 min   Activity Tolerance Patient tolerated treatment well   Behavior During Therapy [facility] for tasks assessed/performed      Past Medical History:  Diagnosis Date  . High cholesterol   . Thyroid disease     No past surgical history on file.  There were no vitals filed for this visit.      Subjective Assessment - 04/16/16 1111    Subjective Pt. reporting he has been performing the HEP feeling the most stretch with posterior shoulder stretch activity however no pain.     Patient Stated Goals "be ready in 4 weeks to get back to the pool & surf trip in May"   Currently in Pain? Yes   Pain Score 1    Pain Location Shoulder   Pain Orientation Right;Lateral   Pain Descriptors / Indicators Discomfort   Pain Type Acute pain   Pain Onset 1 to 4 weeks ago   Pain Frequency Constant   Multiple Pain Sites No           [facility] Adult PT Treatment/Exercise - 04/16/16 1114      Shoulder Exercises: Supine   Protraction AROM;20 reps;Right;Strengthening;Weights   Protraction Weight (lbs) 3     Shoulder Exercises: Seated   Retraction Both;10 reps;Strengthening   Retraction Limitations 5" hold; good technique   Protraction --   Protraction Weight (lbs) --     Shoulder Exercises: Sidelying   Other Sidelying Exercises L sidelying R shoulder in 90 dg abduction circles (medium pizza) x 20 reps each way  no weight with this due to poor control    Other Sidelying  Exercises L sidelying R shoulder protraction at 90 dg abduction 3# x 20 reps      Shoulder Exercises: Standing   External Rotation AROM;15 reps;Strengthening;Right;Theraband  with towel under elbow; pain free   Theraband Level (Shoulder External Rotation) Level 1 (Yellow)   Internal Rotation AROM;15 reps;Theraband;Right   Theraband Level (Shoulder Internal Rotation) Level 1 (Yellow)   Internal Rotation Limitations towel under elbow; pain free   Row AROM;Both;15 reps  pain free   Theraband Level (Shoulder Row) Level 2 (Red)   Row Limitations 5" hold      Shoulder Exercises: Lawyertretch   Corner Stretch 30 seconds;3 reps   Corner Stretch Limitations in doorway   Other Shoulder Stretches Posterior Capsule stretch in doorway; 30" hold x 2 rep; R arm     Manual Therapy   Manual Therapy Taping;Soft tissue mobilization   Manual therapy comments Taping to R deltoid to promote inhibition of deltoid muscle: Y-strip: Anchor to just below deltoid tuberosity, 1 tail to anterior and posterior deltoid (25% stertch)    Soft tissue mobilization STM to R anterior/lateral/posterior deltoid to promote relaxation             PT Short Term Goals - 04/16/16 1112      PT SHORT TERM GOAL #1  Title Pt will be independent with initial HEP by 05/01/16.   Status On-going           PT Long Term Goals - 04/16/16 1112      PT LONG TERM GOAL #1   Title Pt will be independent with advanced HEP by 05/15/16.   Status On-going     PT LONG TERM GOAL #2   Title Pt will have R shoulder AROM WFL and without increased pain at end range by 05/15/16.    Status On-going     PT LONG TERM GOAL #3   Title Pt will increase R UE strength >/=  to 4+/5 to improve overall UE function by 05/15/16.   Status On-going     PT LONG TERM GOAL #4   Title Pt will be able to return to swimming regularly without an increase in R shoulder pain by 05/15/16.   Status On-going               Plan - 04/16/16 1114    Clinical  Impression Statement Pt. seen initially today reporting pain somewhat improved.  Pt. performed HEP yesterday without issue.  HEP reviewed in treatment today requiring min cues for scap. depression with retraction activity.  STM to deltoid today to promote muscular relaxation.  Inhibitory taping applied to deltoid with 25% stretch to further promote muscular relaxation.  Gentle RTC strengthening with yellow TB and scapular stabilization today with pt. responding well.  Pain remained low throughout treatment.  Deltoid muscle recruitment avoided throughout treatment today.  Marland Kitchen  Pt. will continue to benefit from further skilled therapy to improve UE strength, restore ROM, and return to leisure activities.   PT Treatment/Interventions Patient/family education;ADLs/Self Care Home Management;Electrical Stimulation;Cryotherapy;Moist Heat;Therapeutic activities;Therapeutic exercise;Neuromuscular re-education;Manual techniques;Dry needling;Taping;Vasopneumatic Device;Iontophoresis 4mg /ml Dexamethasone   PT Next Visit Plan Monitor respone to R shoulder taping; progress UE strengthening & scapular stabilization exercises as tolerated; taping for deltoid inhibition; manual therapy to R deltoid & modalities PRN      Patient will benefit from skilled therapeutic intervention in order to improve the following deficits and impairments:  Pain, Decreased activity tolerance, Decreased strength, Impaired flexibility, Impaired UE functional use, Postural dysfunction  Visit Diagnosis: Acute pain of right shoulder  Muscle weakness (generalized)     Problem List There are no active problems to display for this patient.   Seth Stanley, PTA 04/16/16 12:28 PM   [facility] [address]  [address] [place], [place], [address] Phone: [phone]   Fax:  [phone]  Name: Seth Stanley MRN: [id number] Date of Birth: Aug 22, 1965

## 2016-04-21 ENCOUNTER — Ambulatory Visit: Payer: BLUE CROSS/BLUE SHIELD | Admitting: Physical Therapy

## 2016-04-21 DIAGNOSIS — M25511 Pain in right shoulder: Secondary | ICD-10-CM | POA: Diagnosis not present

## 2016-04-21 DIAGNOSIS — M6281 Muscle weakness (generalized): Secondary | ICD-10-CM

## 2016-04-21 NOTE — Therapy (Signed)
[facility] [address]  [place], [place], [address] Phone: [phone]   Fax:  [phone]  Physical Therapy Treatment  Patient Details  Name: Arda Keadle MRN: [id number] Date of Birth: Jan 25, 1966 Referring Provider: Dr. Hart Robinsons  Encounter Date: 04/21/2016      PT End of Session - 04/21/16 1110    Visit Number 3   Number of Visits 8   Date for PT Re-Evaluation 05/15/16   Authorization Type BCBS - VL: 14   PT Start Time 1106  pt arrived late and then needed to change clothes in [place]   PT Stop Time 1148   PT Time Calculation (min) 42 min      Past Medical History:  Diagnosis Date  . High cholesterol   . Thyroid disease     No past surgical history on file.  There were no vitals filed for this visit.      Subjective Assessment - 04/21/16 1108    Subjective Pt reporting significant improvement both with decreased frequency and intensity of pain (no >3/10 at worst) as well as improving functional strength.   Patient Stated Goals "be ready in 4 weeks to get back to the pool & surf trip in May"   Currently in Pain? No/denies   Pain Score 0-No pain  3/10 at worst   Pain Location Shoulder   Pain Orientation Right   Pain Descriptors / Indicators Discomfort                         [facility] Adult PT Treatment/Exercise - 04/21/16 1106      Shoulder Exercises: Supine   Protraction Right;20 reps;Weights   Protraction Weight (lbs) 4     Shoulder Exercises: Sidelying   External Rotation Right;15 reps;Weights   External Rotation Weight (lbs) 2   Other Sidelying Exercises L sidelying R shoulder circles in 90 dg abduction CW/CCW 2# x10 each     Shoulder Exercises: Standing   External Rotation Right;15 reps;Theraband   Theraband Level (Shoulder External Rotation) Level 2 (Red)   External Rotation Limitations towel under elbow to reinforce neutral shoudler   Internal Rotation Right;15  reps;Theraband   Theraband Level (Shoulder Internal Rotation) Level 2 (Red)   Internal Rotation Limitations towel under elbow to reinforce neutral shoudler   ABduction Right;15 reps;Theraband   Theraband Level (Shoulder ABduction) Level 2 (Red)   ABduction Limitations in slight scaption   Row Both;15 reps;Theraband   Theraband Level (Shoulder Row) Level 3 (Green)   Row Limitations 5" hold      Shoulder Exercises: ROM/Strengthening   UBE (Upper Arm Bike) lvl 2.5 fwd/back x 3' each     Manual Therapy   Manual Therapy Myofascial release;Taping   Myofascial Release R subscapularis, infraspintus & teres minor in supine   Kinesiotex Inhibit Muscle     Kinesiotix   Inhibit Muscle  R deltoid - "Y" strip anchored just below deltoid tuberosity with tails extending to anterior and posterior deltoid at 30% stretch                 PT Education - 04/21/16 1148    Education provided Yes   Education Details HEP update   Person(s) Educated Patient   Methods Explanation;Demonstration;Handout   Comprehension Verbalized understanding;Returned demonstration          PT Short Term Goals - 04/21/16 1113      PT SHORT TERM GOAL #1  Title Pt will be independent with initial HEP by 05/01/16.   Status Achieved           PT Long Term Goals - 04/16/16 1112      PT LONG TERM GOAL #1   Title Pt will be independent with advanced HEP by 05/15/16.   Status On-going     PT LONG TERM GOAL #2   Title Pt will have R shoulder AROM WFL and without increased pain at end range by 05/15/16.    Status On-going     PT LONG TERM GOAL #3   Title Pt will increase R UE strength >/=  to 4+/5 to improve overall UE function by 05/15/16.   Status On-going     PT LONG TERM GOAL #4   Title Pt will be able to return to swimming regularly without an increase in R shoulder pain by 05/15/16.   Status On-going               Plan - 04/21/16 1112    Clinical Impression Statement Pt reporting good  compliance with HEP with no concerns - STG met. Pt noting significant progress thus far with PT with decreased intensity and frequency of pain, along with improving functional use of R use (now able to wash under L arm w/o pain). Pt tolerating progression of strengthening exercises with no increased pain and only mild fatigue reported, therefore HEP updated to include strengthening exercises. Pt continues to demonstrate tight posterior capsule but reported good relief with MFR. Pt feels that taping may have helped, therefore reapplied deltoid inhibitory pattern. Pt will benefit from continued PT to improve shoulder flexibility and restore functional strength without pain..   Rehab Potential Good   PT Treatment/Interventions Patient/family education;ADLs/Self Care Home Management;Electrical Stimulation;Cryotherapy;Moist Heat;Therapeutic activities;Therapeutic exercise;Neuromuscular re-education;Manual techniques;Dry needling;Taping;Vasopneumatic Device;Iontophoresis 50m/ml Dexamethasone   PT Next Visit Plan Assess reponse to updated HEP; progress UE strengthening & scapular stabilization exercises as tolerated; taping for deltoid inhibition as indicated; manual therapy & modalities PRN   Consulted and Agree with Plan of Care Patient      Patient will benefit from skilled therapeutic intervention in order to improve the following deficits and impairments:  Pain, Decreased activity tolerance, Decreased strength, Impaired flexibility, Impaired UE functional use, Postural dysfunction  Visit Diagnosis: Acute pain of right shoulder  Muscle weakness (generalized)     Problem List There are no active problems to display for this patient.   JPercival Spanish2/27/2018, 12:16 PM  [facility] [place] [address]Phone: [phone]  Fax:  [phone] Name: GMasashi SnowdonMRN: [id number] of Birth:  2May 05, 1967

## 2016-04-23 ENCOUNTER — Ambulatory Visit: Payer: BLUE CROSS/BLUE SHIELD | Attending: Specialist

## 2016-04-23 DIAGNOSIS — R29898 Other symptoms and signs involving the musculoskeletal system: Secondary | ICD-10-CM | POA: Insufficient documentation

## 2016-04-23 DIAGNOSIS — M6281 Muscle weakness (generalized): Secondary | ICD-10-CM | POA: Diagnosis present

## 2016-04-23 DIAGNOSIS — M542 Cervicalgia: Secondary | ICD-10-CM | POA: Insufficient documentation

## 2016-04-23 DIAGNOSIS — M25511 Pain in right shoulder: Secondary | ICD-10-CM | POA: Diagnosis present

## 2016-04-23 NOTE — Therapy (Addendum)
[facility] [address]  [address] [place], [place], [address] Phone: [phone]   Fax:  [phone]  Physical Therapy Treatment  Patient Details  Name: Seth Stanley MRN: [id number] Date of Birth: [birth year] Referring Provider: Dr. Valma Cava  Encounter Date: 04/23/2016      PT End of Session - 04/23/16 0944    Visit Number 4   Number of Visits 8   Date for PT Re-Evaluation 05/15/16   Authorization Type BCBS - VL: 60   PT Start Time 0941  pt. arriving late to therapy and having to change clothes    PT Stop Time 1020   PT Time Calculation (min) 39 min      Past Medical History:  Diagnosis Date  . High cholesterol   . Thyroid disease     No past surgical history on file.  There were no vitals filed for this visit.      Subjective Assessment - 04/23/16 0938    Subjective Pt. reporting updated HEP is going well without issues.  Pt. noting only pain he has felt in the last few days has been with lifting heavy dish out of oven.     Patient Stated Goals "be ready in 4 weeks to get back to the pool & surf trip in May"   Currently in Pain? No/denies   Pain Score 0-No pain   Multiple Pain Sites No     The treatment bellow was provided on 3.1.18.         [facility] Adult PT Treatment/Exercise - 04/28/16 1333      Shoulder Exercises: Supine   Protraction Right;20 reps;Weights   Protraction Weight (lbs) 5   Other Supine Exercises R shoulder ABC's with med ball (2000 Gr) x 2 reps   pt. confirming difficulty here but no pain     Shoulder Exercises: Sidelying   Other Sidelying Exercises L shoulder ABD's with 2# x 2 reps in 90 dg abduction position     Shoulder Exercises: Standing   External Rotation Right;Theraband;10 reps  good technique with HEP review    Theraband Level (Shoulder External Rotation) Level 3 (Green)   External Rotation Limitations towel under elbow to reinforce neutral shoudler   Internal  Rotation Right;Theraband;10 reps  good technique with HEP review    Theraband Level (Shoulder Internal Rotation) Level 3 (Green)   Internal Rotation Limitations towel under elbow to reinforce neutral shoudler   ABduction Right;15 reps;Theraband   Theraband Level (Shoulder ABduction) Level 2 (Red)   ABduction Limitations in slight scaption   Extension Both;Theraband;15 reps   Theraband Level (Shoulder Extension) Level 2 (Red)   Extension Limitations 5" hold; pain free with this; cues for scap. retraction   Row Both;Theraband;10 reps   Theraband Level (Shoulder Row) Level 4 (Blue)   Row Limitations 5" hold      Shoulder Exercises: ROM/Strengthening   UBE (Upper Arm Bike) lvl 2.5 fwd/back x 3' each     Shoulder Exercises: Lawyer 30 seconds;3 reps   Corner Stretch Limitations in doorway     Manual Therapy   Manual Therapy Myofascial release;Taping   Myofascial Release R teres minor area - supine    Kinesiotex Inhibit Muscle     Kinesiotix   Inhibit Muscle  R deltoid - "Y" strip anchored just below deltoid tuberosity with tails extending to anterior and posterior deltoid at 30% stretch  PT Short Term Goals - 04/21/16 1113      PT SHORT TERM GOAL #1   Title Pt will be independent with initial HEP by 05/01/16.   Status Achieved           PT Long Term Goals - 04/16/16 1112      PT LONG TERM GOAL #1   Title Pt will be independent with advanced HEP by 05/15/16.   Status On-going     PT LONG TERM GOAL #2   Title Pt will have R shoulder AROM WFL and without increased pain at end range by 05/15/16.    Status On-going     PT LONG TERM GOAL #3   Title Pt will increase R UE strength >/=  to 4+/5 to improve overall UE function by 05/15/16.   Status On-going     PT LONG TERM GOAL #4   Title Pt will be able to return to swimming regularly without an increase in R shoulder pain by 05/15/16.   Status On-going               Plan - 04/23/16  0944    Clinical Impression Statement Pt. demonstrating good technique with HEP review today only requiring min cues to avoid over tension on yellow TB with abduction.  Minor progression with RTC strengthening and scapular stabilization activities today and tolerated well.  Pt. pain free with all therex today and verbalized he feels he is getting better.  Pt. noting some benefit from R shoulder taping thus this reapplied today.  MFR/TPR continued to teres minor/major area today with pt. confirming, "releasing" feeling with this.  Pt. will continue to benefit from further skilled therapy to improve UE strength, AROM, and return to leisure activities.    PT Treatment/Interventions Patient/family education;ADLs/Self Care Home Management;Electrical Stimulation;Cryotherapy;Moist Heat;Therapeutic activities;Therapeutic exercise;Neuromuscular re-education;Manual techniques;Dry needling;Taping;Vasopneumatic Device;Iontophoresis 4mg /ml Dexamethasone   PT Next Visit Plan Assess reponse to updated HEP; progress UE strengthening & scapular stabilization exercises as tolerated; taping for deltoid inhibition as indicated; manual therapy & modalities PRN      Patient will benefit from skilled therapeutic intervention in order to improve the following deficits and impairments:  Pain, Decreased activity tolerance, Decreased strength, Impaired flexibility, Impaired UE functional use, Postural dysfunction  Visit Diagnosis: Acute pain of right shoulder  Muscle weakness (generalized)     Problem List There are no active problems to display for this patient.   Kermit BaloMicah Haydon Dorris, PTA 04/23/16 10:36 AM   [facility] [address]  [address] [place], [place], [address] Phone: [phone]   Fax:  [phone]  Name: Debe CoderGuilherme Ullman MRN: [id number] Date of Birth: 03/08/1965

## 2016-04-28 ENCOUNTER — Ambulatory Visit: Payer: BLUE CROSS/BLUE SHIELD | Admitting: Physical Therapy

## 2016-04-28 DIAGNOSIS — M6281 Muscle weakness (generalized): Secondary | ICD-10-CM

## 2016-04-28 DIAGNOSIS — M25511 Pain in right shoulder: Secondary | ICD-10-CM

## 2016-04-28 NOTE — Therapy (Signed)
[facility] [address]  [address] [place], [place], [address] Phone: [phone]   Fax:  [phone]  Physical Therapy Treatment  Patient Details  Name: Seth Stanley MRN: [id number] Date of Birth: 09/01/1965 Referring Provider: Dr. Valma CavaAndrew Collins  Encounter Date: 04/28/2016      PT End of Session - 04/28/16 1104    Visit Number 5   Number of Visits 8   Date for PT Re-Evaluation 05/15/16   Authorization Type BCBS - VL: 60   PT Start Time 1104   PT Stop Time 1146   PT Time Calculation (min) 42 min   Activity Tolerance Patient tolerated treatment well   Behavior During Therapy [facility] for tasks assessed/performed      Past Medical History:  Diagnosis Date  . High cholesterol   . Thyroid disease     No past surgical history on file.  There were no vitals filed for this visit.      Subjective Assessment - 04/28/16 1108    Subjective Pt feels like shoulder is getting better and better. No issues with HEP other than fatigue noted with progression to green TB.   Patient Stated Goals "be ready in 4 weeks to get back to the pool & surf trip in May"   Currently in Pain? No/denies                         [facility] PT Treatment/Exercise - 04/28/16 1104      Shoulder Exercises: Prone   Extension Both;10 reps   Extension Limitations "I"   External Rotation Both;10 reps   External Rotation Limitations "W"   Horizontal ABduction 1 Both;10 reps   Horizontal ABduction 1 Limitations "T"   Horizontal ABduction 2 Both;10 reps   Horizontal ABduction 2 Limitations "Y"     Shoulder Exercises: Standing   External Rotation Right;15 reps;Theraband   Theraband Level (Shoulder External Rotation) Level 3 (Green)   External Rotation Limitations towel under elbow to reinforce neutral shoudler   Internal Rotation Right;15 reps;Theraband   Theraband Level (Shoulder Internal Rotation) Level 3 (Green)   Internal  Rotation Limitations towel under elbow to reinforce neutral shoudler   ABduction Right;15 reps;Theraband   Theraband Level (Shoulder ABduction) Level 3 (Green)   ABduction Limitations in slight scaption   Row Both;10 reps   Row Limitations TRX low & mod rows x10 each   Retraction Both;15 reps;Theraband   Theraband Level (Shoulder Retraction) Level 3 (Green)   Retraction Limitations 5" hold + shoulder extension to neutral     Shoulder Exercises: ROM/Strengthening   UBE (Upper Arm Bike) lvl 3.0 fwd/back x 3' each   Wall Pushups 10 reps   Other ROM/Strengthening Exercises BATCA pull-down 25# x10 (emphasis on scapular retraction & depression     Manual Therapy   Manual Therapy Myofascial release;Soft tissue mobilization   Myofascial Release R subscapularis & teres minor in supine                  PT Short Term Goals - 04/21/16 1113      PT SHORT TERM GOAL #1   Title Pt will be independent with initial HEP by 05/01/16.   Status Achieved           PT Long Term Goals - 04/16/16 1112      PT LONG TERM GOAL #1   Title Pt will be independent with advanced HEP by 05/15/16.  Status On-going     PT LONG TERM GOAL #2   Title Pt will have R shoulder AROM WFL and without increased pain at end range by 05/15/16.    Status On-going     PT LONG TERM GOAL #3   Title Pt will increase R UE strength >/=  to 4+/5 to improve overall UE function by 05/15/16.   Status On-going     PT LONG TERM GOAL #4   Title Pt will be able to return to swimming regularly without an increase in R shoulder pain by 05/15/16.   Status On-going               Plan - 04/28/16 1146    Clinical Impression Statement Pt reporting severe fatigue with scapular retraction/shoulder extension as part of HEP but upon review, determined pt was attempting to hold each rep for 15 sec - corrected to 5 sec hold as indicated on HEP and pt able to tolerate exercise w/o difficulty. No other issues with HEP  indentified, and shoulder ABD progressed to green TB. Progressed scapular stabilization today with additon of prone Pball exercises, wall push-ups and TRX rows; with all exercises well tolerated. Pt continue note benefit from [facility] & STM paired with manual stretching with good relief of "tension" noted. Overall, pt demonstrating consistent progress with PT, reporting lessening pain and improving function.   Rehab Potential Good   PT Treatment/Interventions Patient/family education;ADLs/Self Care Home Management;Electrical Stimulation;Cryotherapy;Moist Heat;Therapeutic activities;Therapeutic exercise;Neuromuscular re-education;Manual techniques;Dry needling;Taping;Vasopneumatic Device;Iontophoresis 4mg /ml Dexamethasone   PT Next Visit Plan progress UE strengthening & scapular stabilization exercises as tolerated; taping for deltoid inhibition as indicated; manual therapy & modalities PRN   Consulted and Agree with Plan of Care Patient      Patient will benefit from skilled therapeutic intervention in order to improve the following deficits and impairments:  Pain, Decreased activity tolerance, Decreased strength, Impaired flexibility, Impaired UE functional use, Postural dysfunction  Visit Diagnosis: Acute pain of right shoulder  Muscle weakness (generalized)     Problem List There are no active problems to display for this patient.   Marry Guan, PT, MPT 04/28/2016, 12:04 PM  [facility] [address]  [address] [place], [place], [address] Phone: [phone]   Fax:  [phone]  Name: Seth Stanley MRN: [id number] Date of Birth: Sep 27, 1965

## 2016-04-30 ENCOUNTER — Ambulatory Visit: Payer: BLUE CROSS/BLUE SHIELD

## 2016-04-30 DIAGNOSIS — M25511 Pain in right shoulder: Secondary | ICD-10-CM

## 2016-04-30 DIAGNOSIS — M6281 Muscle weakness (generalized): Secondary | ICD-10-CM

## 2016-04-30 NOTE — Therapy (Signed)
[facility] [place] [address]  [address] [place], [place], [address] Phone: [phone]   Fax:  [phone]  Physical Therapy Treatment  Patient Details  Name: Seth CoderGuilherme Stanley MRN: [id number] Date of Birth: 05/17/1965 Referring Provider: Dr. Valma CavaAndrew Collins  Encounter Date: 04/30/2016      PT End of Session - 04/30/16 1113    Visit Number 6   Number of Visits 8   Date for PT Re-Evaluation 05/15/16   Authorization Type BCBS - VL: 60   PT Start Time 1112  pt. arriving late   PT Stop Time 1154   PT Time Calculation (min) 42 min   Activity Tolerance Patient tolerated treatment well   Behavior During Therapy WFL for tasks assessed/performed      Past Medical History:  Diagnosis Date  . High cholesterol   . Thyroid disease     No past surgical history on file.  There were no vitals filed for this visit.      Subjective Assessment - 04/30/16 1137    Subjective Pt. reporting, "pain is less and less now".     Patient Stated Goals "be ready in 4 weeks to get back to the pool & surf trip in May"   Currently in Pain? No/denies   Pain Score 0-No pain   Multiple Pain Sites No            [facility] Adult PT Treatment/Exercise - 04/30/16 1121      Shoulder Exercises: Prone   Extension Both;15 reps   Extension Limitations "I" prone on green p-ball (65cm)   External Rotation Both;15 reps   External Rotation Limitations "W"   Horizontal ABduction 1 Both;10 reps;Strengthening  less reps due to reported fatigue   Horizontal ABduction 1 Limitations T   Horizontal ABduction 2 Both;10 reps  less reps due to reported fatigue    Horizontal ABduction 2 Limitations "Y"     Shoulder Exercises: Standing   ABduction Right;Theraband;10 reps  stopped following 10 reps due to report of pain    Theraband Level (Shoulder ABduction) Level 3 (Green)   ABduction Limitations in slight scaption   Extension Both;Theraband;15 reps   Theraband  Level (Shoulder Extension) Level 4 (Blue)   Extension Limitations 5" hold; pain free with this; cues for scap. retraction   Row Both;Theraband;10 reps;AROM  3" hold    Row Limitations TRX low and mod row ~50 dg lean back   "Y" row attemted x 3 reps however stopped due to poor tech     Shoulder Exercises: ROM/Strengthening   UBE (Upper Arm Bike) lvl 3.5 fwd/back x 3' each   Wall Pushups 15 reps  at 60 dg    Other ROM/Strengthening Exercises BATCA pull-down 35# x 15 resp (emphasis on scapular retraction & depression  3" hold: pt. able to perform with good technique     Shoulder Exercises: Stretch   Corner Stretch 30 seconds;2 reps   Corner Stretch Limitations in doorway   Other Shoulder Stretches Standing rhomboids stretch at doorframe x 30 sec      Manual Therapy   Manual Therapy Myofascial release;Soft tissue mobilization   Myofascial Release R subscapularis supine and prone in 160 dg flexion                  PT Short Term Goals - 04/21/16 1113      PT SHORT TERM GOAL #1   Title Pt will be independent with initial HEP by 05/01/16.  Status Achieved           PT Long Term Goals - 04/16/16 1112      PT LONG TERM GOAL #1   Title Pt will be independent with advanced HEP by 05/15/16.   Status On-going     PT LONG TERM GOAL #2   Title Pt will have R shoulder AROM WFL and without increased pain at end range by 05/15/16.    Status On-going     PT LONG TERM GOAL #3   Title Pt will increase R UE strength >/=  to 4+/5 to improve overall UE function by 05/15/16.   Status On-going     PT LONG TERM GOAL #4   Title Pt will be able to return to swimming regularly without an increase in R shoulder pain by 05/15/16.   Status On-going               Plan - 04/30/16 1118    Clinical Impression Statement Pt. reporting, "pain is less and less", and he feels he is still improving with therapy.  Pt. tolerated progression of scapular strengthening with addition of more  overhead activities well today.  Overhead ER added today due to pt. report of, "difficulty" with this movement.  Less reps with abduction motion today due to report of onset of pain however pt. ending treatment pain free reporting, "loosened" feeling at R shoulder.  Pt. will continue to benefit from further skilled therapy to maximize function and allow for return to leisure activity.      PT Treatment/Interventions Patient/family education;ADLs/Self Care Home Management;Electrical Stimulation;Cryotherapy;Moist Heat;Therapeutic activities;Therapeutic exercise;Neuromuscular re-education;Manual techniques;Dry needling;Taping;Vasopneumatic Device;Iontophoresis 4mg /ml Dexamethasone   PT Next Visit Plan progress UE strengthening & scapular stabilization exercises as tolerated; taping for deltoid inhibition as indicated; manual therapy & modalities PRN      Patient will benefit from skilled therapeutic intervention in order to improve the following deficits and impairments:  Pain, Decreased activity tolerance, Decreased strength, Impaired flexibility, Impaired UE functional use, Postural dysfunction  Visit Diagnosis: Acute pain of right shoulder  Muscle weakness (generalized)     Problem List There are no active problems to display for this patient.   Kermit Balo, PTA 04/30/16 12:15 PM  [facility] [address]  [address] [place], [place], [address] Phone: [phone]   Fax:  [phone]  Name: Seth Stanley MRN: [id number] Date of Birth: 06/19/1965

## 2016-05-05 ENCOUNTER — Ambulatory Visit: Payer: BLUE CROSS/BLUE SHIELD | Admitting: Physical Therapy

## 2016-05-05 DIAGNOSIS — M25511 Pain in right shoulder: Secondary | ICD-10-CM | POA: Diagnosis not present

## 2016-05-05 DIAGNOSIS — M6281 Muscle weakness (generalized): Secondary | ICD-10-CM

## 2016-05-05 NOTE — Therapy (Signed)
[facility] [place] [address]  [place], [place], [address] Phone: [phone]   Fax:  [phone]  Physical Therapy Treatment  Patient Details  Name: Seth Stanley MRN: [id number] Date of Birth: 07/05/1965 Referring Provider: Dr. Hart Robinsons  Encounter Date: 05/05/2016      PT End of Session - 05/05/16 1111    Visit Number 7   Number of Visits 16   Date for PT Re-Evaluation 06/05/16   Authorization Type BCBS - VL: 53   PT Start Time 1111  pt arrived late & changing clothes   PT Stop Time 1149   PT Time Calculation (min) 38 min   Activity Tolerance Patient tolerated treatment well   Behavior During Therapy [facility] for tasks assessed/performed      Past Medical History:  Diagnosis Date  . High cholesterol   . Thyroid disease     No past surgical history on file.  There were no vitals filed for this visit.      Subjective Assessment - 05/05/16 1113    Subjective Pt reports he woke up with a very small amount of pain in the shoulder today - thinks he may have slept wrong; pain now resolved. States pain is typically only for brief periods of late with very low intensity at ~1/10.   Patient Stated Goals "be ready in 4 weeks to get back to the pool & surf trip in May"   Currently in Pain? No/denies            [facility] PT Assessment - 05/05/16 1111      Assessment   Medical Diagnosis Strain & Sprain of R shoulder; OA of R shoulder   Referring Provider Dr. Hart Robinsons   Onset Date/Surgical Date 03/28/16   Hand Dominance Right   Next MD Visit 05/11/16 @ 2:30     Prior Function   Level of Independence Independent   Vocation Full time employment   Vocation Requirements Computer/Desk work; some traveling   Leisure Being active     AROM   Overall AROM  Within functional limits for tasks performed;Deficits;Due to pain   Overall AROM Comments R shoulder AROM WNL but pain noted with end range R shoulder  horiz ABD & discomfort noted with sustained mid range abduction     Strength   Right Shoulder Flexion 5/5   Right Shoulder Extension 5/5   Right Shoulder ABduction 4-/5  gives due to pain   Right Shoulder Internal Rotation 5/5   Right Shoulder External Rotation 4+/5   Right Shoulder Horizontal ABduction 4+/5   Right Shoulder Horizontal ADduction 4+/5   Left Shoulder Flexion 5/5   Left Shoulder Extension 5/5   Left Shoulder ABduction 5/5   Left Shoulder Internal Rotation 5/5   Left Shoulder External Rotation 5/5   Left Shoulder Horizontal ABduction 5/5   Left Shoulder Horizontal ADduction 5/5                     [facility] Adult PT Treatment/Exercise - 05/05/16 1111      Shoulder Exercises: Prone   Extension Both;15 reps;Weights   Extension Weight (lbs) 2   Extension Limitations "I" prone on green p-ball (65cm)   External Rotation Both;15 reps;Weights   External Rotation Weight (lbs) 1   External Rotation Limitations "W"   Horizontal ABduction 1 Both;15 reps;Weights   Horizontal ABduction 1 Weight (lbs) 2   Horizontal ABduction 1 Limitations "T"   Horizontal ABduction 2  Both;15 reps;Weights   Horizontal ABduction 2 Weight (lbs) 1   Horizontal ABduction 2 Limitations "Y"     Shoulder Exercises: Standing   Horizontal ABduction Both;15 reps   Theraband Level (Shoulder Horizontal ABduction) Level 2 (Red)   Horizontal ABduction Limitations standing with back along doorframe   External Rotation Both;15 reps;Theraband   Theraband Level (Shoulder External Rotation) Level 3 (Green)   External Rotation Limitations at 90 dg ABD + scapular retraction   Row Both;15 reps;Theraband   Theraband Level (Shoulder Row) Level 3 (Green)   Row Limitations "W" row   Other Standing Exercises Shoulder depression + retraction with blue TB 15x3"     Shoulder Exercises: ROM/Strengthening   UBE (Upper Arm Bike) lvl 4.0 fwd/back x 3' each                  PT Short Term Goals -  04/21/16 1113      PT SHORT TERM GOAL #1   Title Pt will be independent with initial HEP by 05/01/16.   Status Achieved           PT Long Term Goals - 05/05/16 1116      PT LONG TERM GOAL #1   Title Pt will be independent with advanced HEP by 06/05/16.   Status Partially Met  met for current     PT LONG TERM GOAL #2   Title Pt will have R shoulder AROM WFL and without increased pain at end range by 06/05/16.    Status Partially Met  AROM WNL but pain at end range horiz ABD     PT LONG TERM GOAL #3   Title Pt will increase R UE strength >/=  to 4+/5 to improve overall UE function by 06/05/16.   Status Partially Met  met except R shoulder ABD 4-/5 d/t pain with resistance     PT LONG TERM GOAL #4   Title Pt will be able to return to swimming regularly without an increase in R shoulder pain by 06/05/16.   Status On-going  not yet attempted               Plan - 05/05/16 1116    Clinical Impression Statement Pt noting continued benefit from PT but still noting occasional pain mostl commonly with activities in R shoulder abduction and horizontal ABD/ADD around 90 dg elevation. R shoulder AROM WNL but pain still noted with end range horiz abduction and strength still lmited due to pain with resisted abduction. Pt reporting good compliance with current HEP but has yet to attempt to return to swimming. Given positive response to therapy and continued deficits, feel pt has good potential to benefit from further skilled PT to maiximize painfree ROM and strength for increased ease of daily tasks and return to leisure activities, therefore recommend recert for continued PT 2x/wk up to an additional 4 weeks.   Rehab Potential Good   PT Frequency 2x / week   PT Duration 4 weeks   PT Treatment/Interventions Patient/family education;ADLs/Self Care Home Management;Electrical Stimulation;Cryotherapy;Moist Heat;Therapeutic activities;Therapeutic exercise;Neuromuscular re-education;Manual  techniques;Dry needling;Taping;Vasopneumatic Device;Iontophoresis 10m/ml Dexamethasone   PT Next Visit Plan progress UE strengthening & scapular stabilization exercises as tolerated; taping for deltoid inhibition as indicated; manual therapy & modalities PRN   Consulted and Agree with Plan of Care Patient      Patient will benefit from skilled therapeutic intervention in order to improve the following deficits and impairments:  Pain, Decreased activity tolerance, Decreased strength, Impaired flexibility, Impaired UE  functional use, Postural dysfunction  Visit Diagnosis: Acute pain of right shoulder  Muscle weakness (generalized)     Problem List There are no active problems to display for this patient.   Percival Spanish, PT, MPT 05/05/2016, 12:10 PM  [facility] [address]  [place], [place], [address] Phone: [phone]   Fax:  [phone]  Name: Wai Litt MRN: [id number] Date of Birth: [birth year]

## 2016-05-07 ENCOUNTER — Ambulatory Visit: Payer: BLUE CROSS/BLUE SHIELD

## 2016-05-07 DIAGNOSIS — M6281 Muscle weakness (generalized): Secondary | ICD-10-CM

## 2016-05-07 DIAGNOSIS — M542 Cervicalgia: Secondary | ICD-10-CM

## 2016-05-07 DIAGNOSIS — M25511 Pain in right shoulder: Secondary | ICD-10-CM

## 2016-05-07 DIAGNOSIS — R29898 Other symptoms and signs involving the musculoskeletal system: Secondary | ICD-10-CM

## 2016-05-07 NOTE — Therapy (Signed)
[facility] [place] [address]  [place], [place], [address] Phone: [phone]   Fax:  [phone]  Physical Therapy Treatment  Patient Details  Name: Seth Stanley MRN: [id number] Date of Birth: [birth year] Referring Provider: Dr. Hart Robinsons  Encounter Date: 05/07/2016      PT End of Session - 05/07/16 1704    Visit Number 8   Number of Visits 16   Date for PT Re-Evaluation 06/05/16   Authorization Type BCBS - VL: 69   PT Start Time 1701   PT Stop Time 1745   PT Time Calculation (min) 44 min   Activity Tolerance Patient tolerated treatment well   Behavior During Therapy [facility] for tasks assessed/performed      Past Medical History:  Diagnosis Date  . High cholesterol   . Thyroid disease     No past surgical history on file.  There were no vitals filed for this visit.      Subjective Assessment - 05/07/16 1741    Subjective Pt. reports doing well today with pain centralized to smaller area today.  Still pain with reach back at shoulder height.     Patient Stated Goals "be ready in 4 weeks to get back to the pool & surf trip in May"   Currently in Pain? No/denies   Pain Score 0-No pain   Multiple Pain Sites No                         [facility] Adult PT Treatment/Exercise - 05/07/16 1710      Shoulder Exercises: Prone   Extension Both;15 reps;Weights   Extension Weight (lbs) 2   Extension Limitations "I" prone on green p-ball (65cm)   External Rotation Both;15 reps;Weights   External Rotation Weight (lbs) 2    Horizontal ABduction 1 Both;15 reps;Weights   Horizontal ABduction 1 Weight (lbs) 2   Horizontal ABduction 1 Limitations "T"   Horizontal ABduction 2 Both;15 reps;Weights   Horizontal ABduction 2 Weight (lbs) 2   Horizontal ABduction 2 Limitations "Y"     Shoulder Exercises: Standing   Horizontal ABduction Both;10 reps   Theraband Level (Shoulder Horizontal ABduction) Level  3 (Green)   External Rotation Both;Theraband;20 reps  3" hold'; 2 sets    Theraband Level (Shoulder External Rotation) Level 3 (Green)   External Rotation Limitations at 90 dg ABD + scapular retraction   Internal Rotation Theraband;20 reps;Both  3" hold; 2 sets   Theraband Level (Shoulder Internal Rotation) Level 3 (Green)   Internal Rotation Limitations Overhead    Row Both;15 reps   Row Limitations on TRX with neutral row, T-row x 15 reps each    Other Standing Exercises Standing R shoulder D1/D2 flexion/extension x 15 reps each way with green TB      Shoulder Exercises: ROM/Strengthening   UBE (Upper Arm Bike) lvl 4.5 fwd/back x 3' each     Shoulder Exercises: Body Blade   Flexion 1 rep;45 seconds  B UE 20-90 dg flexion    External Rotation 45 seconds;1 rep  R shoulder                 PT Education - 05/07/16 1752    Education provided Yes   Education Details overhead internal and external rotation with green center TB issued to pt.    Person(s) Educated Patient   Methods Explanation;Demonstration;Verbal cues;Handout   Comprehension Verbalized understanding;Returned demonstration;Verbal cues required;Need further instruction  PT Short Term Goals - 04/21/16 1113      PT SHORT TERM GOAL #1   Title Pt will be independent with initial HEP by 05/01/16.   Status Achieved           PT Long Term Goals - 05/05/16 1116      PT LONG TERM GOAL #1   Title Pt will be independent with advanced HEP by 06/05/16.   Status Partially Met  met for current     PT LONG TERM GOAL #2   Title Pt will have R shoulder AROM WFL and without increased pain at end range by 06/05/16.    Status Partially Met  AROM WNL but pain at end range horiz ABD     PT LONG TERM GOAL #3   Title Pt will increase R UE strength >/=  to 4+/5 to improve overall UE function by 06/05/16.   Status Partially Met  met except R shoulder ABD 4-/5 d/t pain with resistance     PT LONG TERM GOAL #4    Title Pt will be able to return to swimming regularly without an increase in R shoulder pain by 06/05/16.   Status On-going  not yet attempted               Plan - 05/07/16 1705    Clinical Impression Statement Pt. doing well today reporting he feels better than he did last treatment.  Today's treatment focusing on overhead and diagonal TB activities.  Pt. tolerating introduction to diagonal pattern, body blade, and progression of overhead motion well.  Overhead internal/external rotation added to HEP today with green TB issued to pt.  Pt. able to demo good technique with these today. Pt. will continue to benefit from further skilled therapy to maximize R UE function.     PT Treatment/Interventions Patient/family education;ADLs/Self Care Home Management;Electrical Stimulation;Cryotherapy;Moist Heat;Therapeutic activities;Therapeutic exercise;Neuromuscular re-education;Manual techniques;Dry needling;Taping;Vasopneumatic Device;Iontophoresis '4mg'$ /ml Dexamethasone   PT Next Visit Plan Monitor respone to updated HEP; progress UE strengthening & scapular stabilization exercises as tolerated; taping for deltoid inhibition as indicated; manual therapy & modalities PRN      Patient will benefit from skilled therapeutic intervention in order to improve the following deficits and impairments:  Pain, Decreased activity tolerance, Decreased strength, Impaired flexibility, Impaired UE functional use, Postural dysfunction  Visit Diagnosis: Acute pain of right shoulder  Muscle weakness (generalized)  Upper extremity weakness  Neck pain     Problem List There are no active problems to display for this patient.   Bess Harvest, PTA 05/07/16 5:59 PM  [facility] [place] [address]  [place], [place], [address] Phone: [phone]   Fax:  [phone]  Name: Abdikadir Fohl MRN: [id number] Date of Birth: 1965/07/31

## 2016-05-12 ENCOUNTER — Ambulatory Visit: Payer: BLUE CROSS/BLUE SHIELD | Admitting: Physical Therapy

## 2016-05-12 DIAGNOSIS — M6281 Muscle weakness (generalized): Secondary | ICD-10-CM

## 2016-05-12 DIAGNOSIS — M25511 Pain in right shoulder: Secondary | ICD-10-CM | POA: Diagnosis not present

## 2016-05-12 NOTE — Therapy (Signed)
[facility] [place] [address]  [place], [place], [address] Phone: [phone]   Fax:  [phone]  Physical Therapy Treatment  Patient Details  Name: Seth Stanley MRN: [id number] Date of Birth: 1966-01-13 Referring Provider: Dr. Hart Robinsons  Encounter Date: 05/12/2016      PT End of Session - 05/12/16 1322    Visit Number 9   Number of Visits 16   Date for PT Re-Evaluation 06/05/16   Authorization Type BCBS - VL: 31   PT Start Time 2297  pt arrived late, then changing clothes   PT Stop Time 1407   PT Time Calculation (min) 45 min   Activity Tolerance Patient tolerated treatment well   Behavior During Therapy [facility] for tasks assessed/performed      Past Medical History:  Diagnosis Date  . High cholesterol   . Thyroid disease     No past surgical history on file.  There were no vitals filed for this visit.      Subjective Assessment - 05/12/16 1324    Subjective Pt reports pain is just sporadic at present. Was able to resume swimming on Sat at easy pace and shoulder felt good. Tolerating updated HEP w/o issues. Saw MD yesterday and states MD wants him to continue PT and f/u in another 4 weeks.   Patient Stated Goals "be ready in 4 weeks to get back to the pool & surf trip in May"   Currently in Pain? No/denies            [facility] PT Assessment - 05/12/16 1322      Assessment   Next MD Visit 06/12/16                     [facility] Adult PT Treatment/Exercise - 05/12/16 1322      Shoulder Exercises: Standing   External Rotation Both;20 reps;Theraband   Theraband Level (Shoulder External Rotation) Level 3 (Green)   External Rotation Limitations at 90 dg ABD + scapular retraction   Internal Rotation Right;20 reps;Theraband   Theraband Level (Shoulder Internal Rotation) Level 3 (Green)   Internal Rotation Limitations at 90 dg ABD   Other Standing Exercises Standing R shoulder D1/D2  flexion/extension x15 each with green TB      Shoulder Exercises: ROM/Strengthening   UBE (Upper Arm Bike) lvl 4.5 fwd/back x 3' each     Shoulder Exercises: Stretch   Other Shoulder Stretches R open book stretch 2x30"   Other Shoulder Stretches R posterior capsule stretch 2x30" - doorframe x1 & opp hand hold with ER x1 - pt prefering 2nd technique     Manual Therapy   Manual Therapy Myofascial release;Soft tissue mobilization   Manual therapy comments pt supine & L sidelying   Soft tissue mobilization R subscapularis, infraspintus & teres minor   Myofascial Release R subscapular release & TPR                PT Education - 05/12/16 1405    Education provided Yes   Education Details posterior capsule & open book stretches added to HEP   Person(s) Educated Patient   Methods Explanation;Demonstration;Handout   Comprehension Verbalized understanding;Returned demonstration          PT Short Term Goals - 04/21/16 1113      PT SHORT TERM GOAL #1   Title Pt will be independent with initial HEP by 05/01/16.   Status Achieved  PT Long Term Goals - 05/12/16 1413      PT LONG TERM GOAL #1   Title Pt will be independent with advanced HEP by 06/05/16.   Status Partially Met  met for current     PT LONG TERM GOAL #2   Title Pt will have R shoulder AROM WFL and without increased pain at end range by 06/05/16.    Status Partially Met  AROM WNL but pain at end range horiz ABD     PT LONG TERM GOAL #3   Title Pt will increase R UE strength >/=  to 4+/5 to improve overall UE function by 06/05/16.   Status Partially Met  met except R shoulder ABD 4-/5 d/t pain with resistance     PT LONG TERM GOAL #4   Title Pt will be able to return to swimming regularly without an increase in R shoulder pain by 06/05/16.   Status On-going               Plan - 05/12/16 1330    Clinical Impression Statement Pt continues to report lessening of pain with pain now more  localized and sporadic, typically only with certain movements, but frustrated that pain persists. Seen by MD yesterday and pt states MD feels that continued tighntness is mostly likely at root of remaining pain but will reassess in 4 weeks and consider MRI at that time if pain not resolved. Continued tightness noted in posterior capsule with TP's identified in subscapularis and teres minor/major which were slow to respond to manual therapy. Pt noting good stretch with open book stretch and posterior capsule stretch, therefore these were added to HEP.   PT Treatment/Interventions Patient/family education;ADLs/Self Care Home Management;Electrical Stimulation;Cryotherapy;Moist Heat;Therapeutic activities;Therapeutic exercise;Neuromuscular re-education;Manual techniques;Dry needling;Taping;Vasopneumatic Device;Iontophoresis '4mg'$ /ml Dexamethasone   PT Next Visit Plan progress UE strengthening & scapular stabilization exercises as tolerated; manual therapy as indicated for muscular tightness; taping for deltoid inhibition as indicated; manual therapy & modalities PRN   Consulted and Agree with Plan of Care Patient      Patient will benefit from skilled therapeutic intervention in order to improve the following deficits and impairments:  Pain, Decreased activity tolerance, Decreased strength, Impaired flexibility, Impaired UE functional use, Postural dysfunction  Visit Diagnosis: Acute pain of right shoulder  Muscle weakness (generalized)     Problem List There are no active problems to display for this patient.   Percival Spanish, PT, MPT 05/12/2016, 2:24 PM  [facility] [address]  [place], [place], [address] Phone: [phone]   Fax:  [phone]  Name: Seth Stanley MRN: [id number] Date of Birth: 15-Jul-1965

## 2016-05-14 ENCOUNTER — Ambulatory Visit: Payer: BLUE CROSS/BLUE SHIELD

## 2016-05-14 DIAGNOSIS — M25511 Pain in right shoulder: Secondary | ICD-10-CM

## 2016-05-14 DIAGNOSIS — M6281 Muscle weakness (generalized): Secondary | ICD-10-CM

## 2016-05-14 NOTE — Therapy (Signed)
[facility] [address]  [place], [place], [address] Phone: [phone]   Fax:  [phone]  Physical Therapy Treatment  Patient Details  Name: Seth Stanley MRN: [id number] Date of Birth: [birth year] Referring Provider: Dr. Hart Robinsons  Encounter Date: 05/14/2016      PT End of Session - 05/14/16 1108    Visit Number 10   Number of Visits 16   Date for PT Re-Evaluation 06/05/16   Authorization Type BCBS - VL: 69   PT Start Time 1104  pt. arriving late    PT Stop Time 1150   PT Time Calculation (min) 46 min   Activity Tolerance Patient tolerated treatment well   Behavior During Therapy [facility] for tasks assessed/performed      Past Medical History:  Diagnosis Date  . High cholesterol   . Thyroid disease     No past surgical history on file.  There were no vitals filed for this visit.      Subjective Assessment - 05/14/16 1110    Subjective Pt. reporting he feels new stretches have helped him have less pain since last treatment.     Patient Stated Goals "be ready in 4 weeks to get back to the pool & surf trip in May"   Currently in Pain? No/denies   Pain Score 0-No pain   Multiple Pain Sites No            [facility] PT Assessment - 05/14/16 1157      Observation/Other Assessments   Focus on Therapeutic Outcomes (FOTO)  79% (21% limitation)                      [facility] Adult PT Treatment/Exercise - 05/14/16 1116      Shoulder Exercises: Standing   External Rotation Both;20 reps;Theraband   Theraband Level (Shoulder External Rotation) Level 3 (Green)   External Rotation Limitations at 90 dg ABD + scapular retraction   Internal Rotation Right;20 reps;Theraband   Theraband Level (Shoulder Internal Rotation) Level 3 (Green)   Internal Rotation Limitations at 90 dg ABD   Other Standing Exercises Standing R shoulder D1/D2 flexion/extension x 20 each with green TB      Shoulder  Exercises: ROM/Strengthening   UBE (Upper Arm Bike) lvl 5.0 fwd/back x 3' each   Cybex Row 15 reps   Cybex Row Limitations 45# x 2 sets   3" hold    Other ROM/Strengthening Exercises BATCA pull-down 45# x 15 resp   2 sets      Shoulder Exercises: Stretch   Other Shoulder Stretches R open book stretch 2x30"  second set performed on doorframe in modified version   Other Shoulder Stretches R posterior capsule stretch 2x30" - doorframe x1 & opp hand hold with ER x1      Manual Therapy   Manual Therapy Myofascial release;Soft tissue mobilization   Manual therapy comments pt supine & L sidelying   Soft tissue mobilization R subscapularis, infraspintus & teres minor   Myofascial Release R subscapular release & TPR                  PT Short Term Goals - 04/21/16 1113      PT SHORT TERM GOAL #1   Title Pt will be independent with initial HEP by 05/01/16.   Status Achieved           PT Long Term Goals - 05/12/16 1413  PT LONG TERM GOAL #1   Title Pt will be independent with advanced HEP by 06/05/16.   Status Partially Met  met for current     PT LONG TERM GOAL #2   Title Pt will have R shoulder AROM WFL and without increased pain at end range by 06/05/16.    Status Partially Met  AROM WNL but pain at end range horiz ABD     PT LONG TERM GOAL #3   Title Pt will increase R UE strength >/=  to 4+/5 to improve overall UE function by 06/05/16.   Status Partially Met  met except R shoulder ABD 4-/5 d/t pain with resistance     PT LONG TERM GOAL #4   Title Pt will be able to return to swimming regularly without an increase in R shoulder pain by 06/05/16.   Status On-going               Plan - 05/14/16 1111    Clinical Impression Statement Pt. reporting new stretches added last treatment have provided relief and made pain less frequent.  Today's treatment with progression of diagonal patterns and machine strengthening activity.  Continued relief with TPR/STM to  Subscapularis, Infraspinatus, and Teres Minor.  Pt. reporting feeling loose following this.  Pt. will continue to benefit from further skilled therapy to allow for return to functional and leisure activity with less pain.     PT Treatment/Interventions Patient/family education;ADLs/Self Care Home Management;Electrical Stimulation;Cryotherapy;Moist Heat;Therapeutic activities;Therapeutic exercise;Neuromuscular re-education;Manual techniques;Dry needling;Taping;Vasopneumatic Device;Iontophoresis '4mg'$ /ml Dexamethasone   PT Next Visit Plan Progress overhead strengthening & scapular stabilization exercises as tolerated; manual therapy as indicated for muscular tightness; manual therapy & modalities PRN      Patient will benefit from skilled therapeutic intervention in order to improve the following deficits and impairments:  Pain, Decreased activity tolerance, Decreased strength, Impaired flexibility, Impaired UE functional use, Postural dysfunction  Visit Diagnosis: Acute pain of right shoulder  Muscle weakness (generalized)     Problem List There are no active problems to display for this patient.   Bess Harvest, PTA 05/14/16 12:11 PM  [facility] [address]  [place], [place], [address] Phone: [phone]   Fax:  [phone]  Name: Taitum Alms MRN: [id number] Date of Birth: 29-Jun-1965

## 2016-05-19 ENCOUNTER — Ambulatory Visit: Payer: BLUE CROSS/BLUE SHIELD | Admitting: Physical Therapy

## 2016-05-19 DIAGNOSIS — M25511 Pain in right shoulder: Secondary | ICD-10-CM

## 2016-05-19 DIAGNOSIS — M6281 Muscle weakness (generalized): Secondary | ICD-10-CM

## 2016-05-19 NOTE — Therapy (Signed)
[facility] [address]  [place], [place], [address] Phone: [phone]   Fax:  [phone]  Physical Therapy Treatment  Patient Details  Name: Seth Stanley MRN: [id number] Date of Birth: 1965-09-16 Referring Provider: Dr. Hart Robinsons  Encounter Date: 05/19/2016      PT End of Session - 05/19/16 1112    Visit Number 11   Number of Visits 16   Date for PT Re-Evaluation 06/05/16   Authorization Type BCBS - VL: 39   PT Start Time 1112  pt arrived late & then changing clothes   PT Stop Time 1152   PT Time Calculation (min) 40 min   Activity Tolerance Patient tolerated treatment well   Behavior During Therapy [facility] for tasks assessed/performed      Past Medical History:  Diagnosis Date  . High cholesterol   . Thyroid disease     No past surgical history on file.  There were no vitals filed for this visit.      Subjective Assessment - 05/19/16 1114    Subjective Pt stated he was throwing stones with his dtr over the weekend and noted pain during the act of throwing, but no pain afterward. Reports lessening pain when lifting his backpack from the passengers seat in the car.   Patient Stated Goals "be ready in 4 weeks to get back to the pool & surf trip in May"   Currently in Pain? No/denies                         [facility] Adult PT Treatment/Exercise - 05/19/16 1112      Shoulder Exercises: Prone   Extension Both;15 reps;Weights   Extension Weight (lbs) 3   Extension Limitations "I" prone on green p-ball (65cm)   External Rotation Both;15 reps;Weights   External Rotation Weight (lbs) 3   External Rotation Limitations "W"   Horizontal ABduction 1 Both;15 reps;Weights   Horizontal ABduction 1 Weight (lbs) 3   Horizontal ABduction 1 Limitations "T"   Other Prone Exercises Prone plank from knees - Alt protraction rock on inverted BOSU x15     Shoulder Exercises: Standing   Row  Both;15 reps  each   Row Limitations TRX - low, mid & mid + ER rows   Other Standing Exercises TRX push-up x15     Shoulder Exercises: ROM/Strengthening   UBE (Upper Arm Bike) lvl 5.0 fwd/back x 3' each   Cybex Press 15 reps   Cybex Press Limitations 35# - chest press plus   Other ROM/Strengthening Exercises BATCA pull-down 45# x 15      Manual Therapy   Manual Therapy Soft tissue mobilization;Myofascial release   Manual therapy comments pt supine   Soft tissue mobilization R subscapularis, infraspintus & teres minor/major   Myofascial Release TPR - R teres major/minor                  PT Short Term Goals - 04/21/16 1113      PT SHORT TERM GOAL #1   Title Pt will be independent with initial HEP by 05/01/16.   Status Achieved           PT Long Term Goals - 05/12/16 1413      PT LONG TERM GOAL #1   Title Pt will be independent with advanced HEP by 06/05/16.   Status Partially Met  met for current     PT LONG TERM GOAL #  2   Title Pt will have R shoulder AROM WFL and without increased pain at end range by 06/05/16.    Status Partially Met  AROM WNL but pain at end range horiz ABD     PT LONG TERM GOAL #3   Title Pt will increase R UE strength >/=  to 4+/5 to improve overall UE function by 06/05/16.   Status Partially Met  met except R shoulder ABD 4-/5 d/t pain with resistance     PT LONG TERM GOAL #4   Title Pt will be able to return to swimming regularly without an increase in R shoulder pain by 06/05/16.   Status On-going               Plan - 05/19/16 1118    Clinical Impression Statement Pt noting lessening R shoulder pain with normal daily tasks including picking up backpack from passengers seat in car (previously consistent trigger for pain), but did note brief pains during act of throwing stones with his daughter over the weekend. Good tolerance for exercise progression, but issues still noted in certain angles of motion. Continued tightness/taught  band of muscle noted in R teres major/minor with TP's identified in the same area, which responded well to STM/MFR/TPR.    Rehab Potential Good   PT Treatment/Interventions Patient/family education;ADLs/Self Care Home Management;Electrical Stimulation;Cryotherapy;Moist Heat;Therapeutic activities;Therapeutic exercise;Neuromuscular re-education;Manual techniques;Dry needling;Taping;Vasopneumatic Device;Iontophoresis 26m/ml Dexamethasone   PT Next Visit Plan Progress overhead strengthening & scapular stabilization exercises as tolerated; manual therapy as indicated for muscular tightness; manual therapy & modalities PRN   Consulted and Agree with Plan of Care Patient      Patient will benefit from skilled therapeutic intervention in order to improve the following deficits and impairments:  Pain, Decreased activity tolerance, Decreased strength, Impaired flexibility, Impaired UE functional use, Postural dysfunction  Visit Diagnosis: Acute pain of right shoulder  Muscle weakness (generalized)     Problem List There are no active problems to display for this patient.   JPercival Spanish PT, MPT 05/19/2016, 11:59 AM  [facility] [place] [address]Phone: [phone]  Fax:  [phone] Name: Seth NoguezMRN: [id number] of Birth: [birth year]

## 2016-05-21 ENCOUNTER — Ambulatory Visit: Payer: BLUE CROSS/BLUE SHIELD

## 2016-05-21 DIAGNOSIS — M25511 Pain in right shoulder: Secondary | ICD-10-CM | POA: Diagnosis not present

## 2016-05-21 DIAGNOSIS — M6281 Muscle weakness (generalized): Secondary | ICD-10-CM

## 2016-05-21 NOTE — Therapy (Signed)
[facility] [address]  [place], [place], [address] Phone: [phone]   Fax:  [phone]  Physical Therapy Treatment  Patient Details  Name: Seth Stanley MRN: [id number] Date of Birth: 08/27/1965 Referring Provider: Dr. Hart Robinsons  Encounter Date: 05/21/2016      PT End of Session - 05/21/16 1113    Visit Number 12   Number of Visits 16   Date for PT Re-Evaluation 06/05/16   Authorization Type BCBS - VL: 39   PT Start Time 1110  pt. arrived late    PT Stop Time 1153   PT Time Calculation (min) 43 min   Activity Tolerance Patient tolerated treatment well   Behavior During Therapy [facility] for tasks assessed/performed      Past Medical History:  Diagnosis Date  . High cholesterol   . Thyroid disease     No past surgical history on file.  There were no vitals filed for this visit.      Subjective Assessment - 05/21/16 1111    Subjective Pt. reporting he is still having less pain at this point with reaching activities such as picking up backpack.  Pain free reaching to put on seatbelt which previously caused pain.     Patient Stated Goals "be ready in 4 weeks to get back to the pool & surf trip in May"   Currently in Pain? No/denies   Pain Score 0-No pain   Multiple Pain Sites No                         [facility] Adult PT Treatment/Exercise - 05/21/16 1114      Self-Care   Self-Care Other Self-Care Comments   Other Self-Care Comments  instruction and demonstration of self myofacial release to teres major/minor areas with self ball massage     Shoulder Exercises: Prone   Extension Both;15 reps;Weights   Extension Weight (lbs) 3   Extension Limitations "I" prone on green p-ball (65cm)   External Rotation Both;15 reps;Weights   External Rotation Weight (lbs) 3   External Rotation Limitations "W"   Horizontal ABduction 1 Both;15 reps;Weights   Horizontal ABduction 1 Weight (lbs)  3   Horizontal ABduction 1 Limitations "T"   Horizontal ABduction 2 Both;15 reps;Weights   Horizontal ABduction 2 Weight (lbs) 3   Horizontal ABduction 2 Limitations "Y"     Shoulder Exercises: Standing   ABduction AROM;Right;Strengthening;15 reps;Theraband  2 sets    Theraband Level (Shoulder ABduction) Level 3 (Green)   Row Both;15 reps   Row Limitations TRX - low, mid & mid + ER rows     Shoulder Exercises: ROM/Strengthening   Other ROM/Strengthening Exercises BATCA pull-down 55# x 15 reps     Shoulder Exercises: Stretch   Other Shoulder Stretches R lateral trunk lean with childes pose for R lat/teres major stretch 2 x 30 sec; 2nd set in standing        Shoulder Exercises: Body Blade   ABduction 30 seconds;1 rep   ABduction Limitations 60dg -120 dg    External Rotation 30 seconds;1 rep   External Rotation Limitations overhead      Manual Therapy   Manual Therapy Soft tissue mobilization;Myofascial release   Manual therapy comments pt supine   Soft tissue mobilization R subscapularis, infraspintus & teres minor/major   Myofascial Release TPR - R teres major/minor  PT Short Term Goals - 04/21/16 1113      PT SHORT TERM GOAL #1   Title Pt will be independent with initial HEP by 05/01/16.   Status Achieved           PT Long Term Goals - 05/12/16 1413      PT LONG TERM GOAL #1   Title Pt will be independent with advanced HEP by 06/05/16.   Status Partially Met  met for current     PT LONG TERM GOAL #2   Title Pt will have R shoulder AROM WFL and without increased pain at end range by 06/05/16.    Status Partially Met  AROM WNL but pain at end range horiz ABD     PT LONG TERM GOAL #3   Title Pt will increase R UE strength >/=  to 4+/5 to improve overall UE function by 06/05/16.   Status Partially Met  met except R shoulder ABD 4-/5 d/t pain with resistance     PT LONG TERM GOAL #4   Title Pt will be able to return to swimming regularly  without an increase in R shoulder pain by 06/05/16.   Status On-going               Plan - 05/21/16 1202    Clinical Impression Statement Pt. reporting less pain with picking up backpack and pain free reaching across body to put on seatbelt, which had previously caused pain.  Pt. still noting benefit from manual work to Teres major/Minor areas performed last treatment thus this continued today.  Aggressive TPR/MFR to these areas today with pt. reporting loosened feeling following.  Overhead body blade in abduction, ER positions today and tolerated well.  Standing shoulder abduction progressed to green TB for home performance today.  Pt. progressing well and will continue to benefit from further skilled therapy to maximize function.     PT Treatment/Interventions Patient/family education;ADLs/Self Care Home Management;Electrical Stimulation;Cryotherapy;Moist Heat;Therapeutic activities;Therapeutic exercise;Neuromuscular re-education;Manual techniques;Dry needling;Taping;Vasopneumatic Device;Iontophoresis '4mg'$ /ml Dexamethasone   PT Next Visit Plan Progress overhead strengthening & scapular stabilization exercises as tolerated; manual therapy as indicated for muscular tightness; manual therapy & modalities PRN      Patient will benefit from skilled therapeutic intervention in order to improve the following deficits and impairments:  Pain, Decreased activity tolerance, Decreased strength, Impaired flexibility, Impaired UE functional use, Postural dysfunction  Visit Diagnosis: Acute pain of right shoulder  Muscle weakness (generalized)     Problem List There are no active problems to display for this patient.   Bess Harvest, PTA 05/21/16 12:12 PM  [facility] [address]  [place], [place], [address] Phone: [phone]   Fax:  [phone]  Name: Seth Stanley MRN: [id number] Date of Birth: 1965/07/30

## 2016-05-26 ENCOUNTER — Ambulatory Visit: Payer: BLUE CROSS/BLUE SHIELD | Attending: Specialist

## 2016-05-26 DIAGNOSIS — R29898 Other symptoms and signs involving the musculoskeletal system: Secondary | ICD-10-CM | POA: Insufficient documentation

## 2016-05-26 DIAGNOSIS — M6281 Muscle weakness (generalized): Secondary | ICD-10-CM | POA: Diagnosis present

## 2016-05-26 DIAGNOSIS — M542 Cervicalgia: Secondary | ICD-10-CM | POA: Insufficient documentation

## 2016-05-26 DIAGNOSIS — M25511 Pain in right shoulder: Secondary | ICD-10-CM | POA: Diagnosis not present

## 2016-05-26 NOTE — Therapy (Signed)
[facility] [address]  [place], [place], [address] Phone: [phone]   Fax:  [phone]  Physical Therapy Treatment  Patient Details  Name: Seth Stanley MRN: [id number] Date of Birth: 21-Jan-1966 Referring Provider: Dr. Hart Robinsons  Encounter Date: 05/26/2016      PT End of Session - 05/26/16 1118    Visit Number 13   Number of Visits 16   Date for PT Re-Evaluation 06/05/16   Authorization Type BCBS - VL: 61   PT Start Time 1113  pt. arrivad late    PT Stop Time 1152   PT Time Calculation (min) 39 min   Activity Tolerance Patient tolerated treatment well   Behavior During Therapy [facility] for tasks assessed/performed      Past Medical History:  Diagnosis Date  . High cholesterol   . Thyroid disease     No past surgical history on file.  There were no vitals filed for this visit.      Subjective Assessment - 05/26/16 1116    Subjective Pt. able to perform 20 pushups without pain yesterday and able to swim for 1.5 hours over weekend without pain.     Patient Stated Goals "be ready in 4 weeks to get back to the pool & surf trip in May"   Currently in Pain? No/denies   Pain Score 0-No pain   Multiple Pain Sites No                         [facility] Adult PT Treatment/Exercise - 05/26/16 1126      Shoulder Exercises: Standing   ABduction AROM;Right;Strengthening;15 reps;Theraband   Theraband Level (Shoulder ABduction) Level 3 (Green)   ABduction Limitations good fatigue following 2nd set    Row Both;15 reps   Row Limitations TRX - low, mid & mid + ER rows     Shoulder Exercises: ROM/Strengthening   UBE (Upper Arm Bike) lvl 5.0 fwd/back x 3' each   Cybex Press 20 reps   Cybex Press Limitations 45# chest press plus    Other ROM/Strengthening Exercises BATCA pull-down 55# x 15 reps   Other ROM/Strengthening Exercises R single arm BATCA row 10# x 15 reps     Shoulder Exercises:  Stretch   Corner Stretch 1 rep;30 seconds  high doorway stretch   Other Shoulder Stretches R lateral trunk lean with childes pose for R lat/teres major stretch 2 x 30 sec; 2nd set in standing      Other Shoulder Stretches posterior shoulder capsule stretch x 30 sec      Manual Therapy   Manual Therapy Soft tissue mobilization;Myofascial release   Manual therapy comments pt supine   Soft tissue mobilization R subscapularis, infraspintus & teres minor/major   Myofascial Release TPR - R teres major/minor                  PT Short Term Goals - 04/21/16 1113      PT SHORT TERM GOAL #1   Title Pt will be independent with initial HEP by 05/01/16.   Status Achieved           PT Long Term Goals - 05/12/16 1413      PT LONG TERM GOAL #1   Title Pt will be independent with advanced HEP by 06/05/16.   Status Partially Met  met for current     PT LONG TERM GOAL #2   Title Pt will  have R shoulder AROM WFL and without increased pain at end range by 06/05/16.    Status Partially Met  AROM WNL but pain at end range horiz ABD     PT LONG TERM GOAL #3   Title Pt will increase R UE strength >/=  to 4+/5 to improve overall UE function by 06/05/16.   Status Partially Met  met except R shoulder ABD 4-/5 d/t pain with resistance     PT LONG TERM GOAL #4   Title Pt will be able to return to swimming regularly without an increase in R shoulder pain by 06/05/16.   Status On-going               Plan - 05/26/16 1119    Clinical Impression Statement Pt. tolerated progression of machine strengthening activity and continued manual massage to posterior shoulder well today.  Pt. was able to swim 1.5 hrs over weekend without pain and only has pain while lifting heavy objects into abduction/ER motions now.  Bilateral lateral raise with black looped TB added today to simulate painful motion with pt. able to tolerate well.  Continued progression of scapular stabilization activities today with  pt. confirming good fatigue level following this.  Pt. still noting he does not feel, "shoulders are symmetrical" yet.  Will plan to shift focus toward post d/c gym/home program in coming visits to prevent chance of re-injury following therapy.      PT Treatment/Interventions Patient/family education;ADLs/Self Care Home Management;Electrical Stimulation;Cryotherapy;Moist Heat;Therapeutic activities;Therapeutic exercise;Neuromuscular re-education;Manual techniques;Dry needling;Taping;Vasopneumatic Device;Iontophoresis 25m/ml Dexamethasone   PT Next Visit Plan Begin review of post-d/c program; Progress overhead strengthening & scapular stabilization exercises as tolerated; manual therapy as indicated for muscular tightness; manual therapy & modalities PRN      Patient will benefit from skilled therapeutic intervention in order to improve the following deficits and impairments:  Pain, Decreased activity tolerance, Decreased strength, Impaired flexibility, Impaired UE functional use, Postural dysfunction  Visit Diagnosis: Acute pain of right shoulder  Muscle weakness (generalized)     Problem List There are no active problems to display for this patient.   MBess Harvest PTA 05/26/16 12:07 PM   [facility] [address] [place] [address]Phone: [phone]  Fax:  [phone] Name: Seth BagnallMRN: [id number] of Birth: 201-23-1967

## 2016-05-28 ENCOUNTER — Ambulatory Visit: Payer: BLUE CROSS/BLUE SHIELD

## 2016-05-28 DIAGNOSIS — M25511 Pain in right shoulder: Secondary | ICD-10-CM

## 2016-05-28 DIAGNOSIS — R29898 Other symptoms and signs involving the musculoskeletal system: Secondary | ICD-10-CM

## 2016-05-28 DIAGNOSIS — M542 Cervicalgia: Secondary | ICD-10-CM

## 2016-05-28 DIAGNOSIS — M6281 Muscle weakness (generalized): Secondary | ICD-10-CM

## 2016-05-28 NOTE — Therapy (Signed)
[facility] [address]  [place], [place], [address] Phone: [phone]   Fax:  [phone]  Physical Therapy Treatment  Patient Details  Name: Seth Stanley MRN: [id number] Date of Birth: October 04, 1965 Referring Provider: Dr. Hart Robinsons  Encounter Date: 05/28/2016      PT End of Session - 05/28/16 1631    Visit Number 14   Number of Visits 16   Date for PT Re-Evaluation 06/05/16   Authorization Type BCBS - VL: 73   PT Start Time 9628  pt. arrived late then had to change clothes    PT Stop Time 1500   PT Time Calculation (min) 35 min   Activity Tolerance Patient tolerated treatment well   Behavior During Therapy [facility] for tasks assessed/performed      Past Medical History:  Diagnosis Date  . High cholesterol   . Thyroid disease     No past surgical history on file.  There were no vitals filed for this visit.      Subjective Assessment - 05/28/16 1628    Subjective Pt. reporting he has not felt pain since Monday night while performing ER with HEP.    Patient Stated Goals "be ready in 4 weeks to get back to the pool & surf trip in May"   Currently in Pain? No/denies   Pain Score 0-No pain   Multiple Pain Sites No            [facility] PT Assessment - 05/28/16 1748      Strength   Right/Left Shoulder Right   Right Shoulder ABduction 4+/5                     [facility] Adult PT Treatment/Exercise - 05/28/16 1638      Shoulder Exercises: Standing   ABduction AROM;Right;Strengthening;15 reps;Theraband  Pt. reporting less fatigue with this todya    Theraband Level (Shoulder ABduction) Level 3 (Green)   Row Both;15 reps   Row Limitations TRX - low, mid & mid + ER rows   Other Standing Exercises Standing front shoulder raise with B UE looped TB from low 2 x 15 reps      Shoulder Exercises: ROM/Strengthening   UBE (Upper Arm Bike) lvl 5.0 fwd/back x 3' each   Cybex Row 10 reps   Cybex  Row Limitations 55#    Other ROM/Strengthening Exercises BATCA pull-down 55# x 15 reps     Shoulder Exercises: Stretch   Other Shoulder Stretches R lateral trunk lean with childes pose for R lat/teres major stretch 2 x 30 sec; 2nd set in standing      Other Shoulder Stretches posterior shoulder capsule stretch x 30 sec      Manual Therapy   Manual Therapy Myofascial release   Manual therapy comments pt supine   Myofascial Release TPR - R teres major/minor  still noting relief with this                 PT Education - 05/28/16 1745    Education provided Yes   Education Details Childs pose lats stretch, standing lats stretch, front shoulder raise with B UE with large looped black TB issued to pt.    Person(s) Educated Patient   Methods Explanation;Demonstration;Handout   Comprehension Verbalized understanding;Returned demonstration          PT Short Term Goals - 04/21/16 1113      PT SHORT TERM GOAL #1   Title Pt  will be independent with initial HEP by 05/01/16.   Status Achieved           PT Long Term Goals - 05/28/16 1747      PT LONG TERM GOAL #1   Title Pt will be independent with advanced HEP by 06/05/16.   Status Partially Met  met for current     PT LONG TERM GOAL #2   Title Pt will have R shoulder AROM WFL and without increased pain at end range by 06/05/16.    Status Partially Met  AROM WNL but pain at end range horiz ABD     PT LONG TERM GOAL #3   Title Pt will increase R UE strength >/=  to 4+/5 to improve overall UE function by 06/05/16.   Status Achieved     PT LONG TERM GOAL #4   Title Pt will be able to return to swimming regularly without an increase in R shoulder pain by 06/05/16.   Status Achieved               Plan - 05/28/16 1632    Clinical Impression Statement Pt. reporting he has been able to swim for longer periods and has not had shoulder pain with this.  R shoulder abduction testing at 4+/5 today pain free.  Strengthening  activity progressed today with continued front raise for deltoid activation.  Pt. issued black looped TB for front raise and lats stretch for HEP addition.  Pt. still noting relief and, "releasing" with R teres minor/major TPR today.  Pt. progressing well only reporting pain a few days ago with resisted overhead ER with HEP activity.  Pt. on track to meet remaining goals and nearing end of POC.  Will plan to continue review of post-d/c program in coming visits.     PT Treatment/Interventions Patient/family education;ADLs/Self Care Home Management;Electrical Stimulation;Cryotherapy;Moist Heat;Therapeutic activities;Therapeutic exercise;Neuromuscular re-education;Manual techniques;Dry needling;Taping;Vasopneumatic Device;Iontophoresis '4mg'$ /ml Dexamethasone   PT Next Visit Plan Begin review of post-d/c program; Progress overhead strengthening & scapular stabilization exercises as tolerated; manual therapy as indicated for muscular tightness; manual therapy & modalities PRN      Patient will benefit from skilled therapeutic intervention in order to improve the following deficits and impairments:  Pain, Decreased activity tolerance, Decreased strength, Impaired flexibility, Impaired UE functional use, Postural dysfunction  Visit Diagnosis: Acute pain of right shoulder  Muscle weakness (generalized)  Upper extremity weakness  Neck pain     Problem List There are no active problems to display for this patient.   Bess Harvest, PTA 05/28/16 5:55 PM  [facility] [address]  [place], [place], [address] Phone: [phone]   Fax:  [phone]  Name: Seth Stanley MRN: [id number] Date of Birth: 1965/12/14

## 2016-06-02 ENCOUNTER — Ambulatory Visit: Payer: BLUE CROSS/BLUE SHIELD

## 2016-06-04 ENCOUNTER — Ambulatory Visit: Payer: BLUE CROSS/BLUE SHIELD | Admitting: Physical Therapy

## 2016-06-04 DIAGNOSIS — M25511 Pain in right shoulder: Secondary | ICD-10-CM | POA: Diagnosis not present

## 2016-06-04 DIAGNOSIS — M6281 Muscle weakness (generalized): Secondary | ICD-10-CM

## 2016-06-04 NOTE — Therapy (Signed)
[facility] [address]  [place], [place], [address] Phone: [phone]   Fax:  [phone]  Physical Therapy Treatment  Patient Details  Name: Seth Stanley MRN: [id number] Date of Birth: February 19, 1966 Referring Provider: Dr. Hart Robinsons  Encounter Date: 06/04/2016      PT End of Session - 06/04/16 1103    Visit Number 15   Number of Visits 16   Date for PT Re-Evaluation 06/05/16   Authorization Type BCBS - VL: 47   PT Start Time 1103   PT Stop Time 1145   PT Time Calculation (min) 42 min   Activity Tolerance Patient tolerated treatment well   Behavior During Therapy [facility] for tasks assessed/performed      Past Medical History:  Diagnosis Date  . High cholesterol   . Thyroid disease     No past surgical history on file.  There were no vitals filed for this visit.      Subjective Assessment - 06/04/16 1107    Subjective Pt noting increased pain wrapping around outside of shoulder after walking/running with dog 2 nights ago. Also notes increased discomfort with using computer mouse for prolonged periods or when sitting in a meeting leaning on R arm. Has been able to swim freestyle crawl & breaststroke w/o pain but has not attempted backstroke or butterfly. Does noted increased pressure with diving into pool and maintaining arms over head while dolphin kicking to reach surface.   Patient Stated Goals "be ready in 4 weeks to get back to the pool & surf trip in May"   Currently in Pain? No/denies                         [facility] Adult PT Treatment/Exercise - 06/04/16 1103      Self-Care   Other Self-Care Comments  review of self myofacial release to teres major/minor areas with self ball massage     Shoulder Exercises: Standing   Horizontal ABduction Both;20 reps;Theraband   Theraband Level (Shoulder Horizontal ABduction) Level 3 (Green)   Horizontal ABduction Limitations standing with  back along doorframe   External Rotation Right;20 reps;Theraband   Theraband Level (Shoulder External Rotation) Level 4 (Blue)   External Rotation Limitations neutral shoulder   Internal Rotation Right;20 reps;Theraband   Theraband Level (Shoulder Internal Rotation) Level 4 (Blue)   Internal Rotation Limitations neutral shoulder - cues to avoid rounding shoulder   Extension Both;Theraband;15 reps   Theraband Level (Shoulder Extension) Level 4 (Blue)   Extension Limitations emphasis on scap retraction   Row Both;15 reps;Theraband   Theraband Level (Shoulder Row) Level 4 (Blue)   Retraction Both;15 reps;Theraband   Theraband Level (Shoulder Retraction) Level 4 (Blue)   Retraction Limitations "W" row   Other Standing Exercises Standing R shoulder D1/D2 flexion/extension x15 each with green TB      Shoulder Exercises: ROM/Strengthening   UBE (Upper Arm Bike) lvl 5.0 fwd/back x 3' each   Wall Pushups 10 reps   Pushups Limitations counter top ~60 dg                  PT Short Term Goals - 04/21/16 1113      PT SHORT TERM GOAL #1   Title Pt will be independent with initial HEP by 05/01/16.   Status Achieved           PT Long Term Goals - 05/28/16 1747  PT LONG TERM GOAL #1   Title Pt will be independent with advanced HEP by 06/05/16.   Status Partially Met  met for current     PT LONG TERM GOAL #2   Title Pt will have R shoulder AROM WFL and without increased pain at end range by 06/05/16.    Status Partially Met  AROM WNL but pain at end range horiz ABD     PT LONG TERM GOAL #3   Title Pt will increase R UE strength >/=  to 4+/5 to improve overall UE function by 06/05/16.   Status Achieved     PT LONG TERM GOAL #4   Title Pt will be able to return to swimming regularly without an increase in R shoulder pain by 06/05/16.   Status Achieved               Plan - 06/04/16 1113    Clinical Impression Statement Pt noting increased pain this week with  activities that place shoulder in protracted position (walking the dog on a lease, computer mouse work , etc) and pt noted to frequently round shoulder forward during exercises (IR with TB in neutral and 90 dg ABD, ER in 90 dg ABD, deltoid UE lift, shoulder diagonals) when not cued for scapular retraction. Emphaized postural awareness during exercises and need for good scapular retraction/stabilization to maintain neutral shoulder alignment. Pt has yet to attempt butterfly or backstroke while swimming, therefore encouraged pt to gradually reintroduce these strokes before final therapy visit next week.   Rehab Potential Good   PT Treatment/Interventions Patient/family education;ADLs/Self Care Home Management;Electrical Stimulation;Cryotherapy;Moist Heat;Therapeutic activities;Therapeutic exercise;Neuromuscular re-education;Manual techniques;Dry needling;Taping;Vasopneumatic Device;Iontophoresis '4mg'$ /ml Dexamethasone   PT Next Visit Plan Final HEP review as indicated; Goal assessment & anticipated discharge   Consulted and Agree with Plan of Care Patient      Patient will benefit from skilled therapeutic intervention in order to improve the following deficits and impairments:  Pain, Decreased activity tolerance, Decreased strength, Impaired flexibility, Impaired UE functional use, Postural dysfunction  Visit Diagnosis: Acute pain of right shoulder  Muscle weakness (generalized)     Problem List There are no active problems to display for this patient.   Percival Spanish, PT, MPT 06/04/2016, 12:15 PM  [facility] [address]  [place], [place], [address] Phone: [phone]   Fax:  [phone]  Name: Seth Stanley MRN: [id number] Date of Birth: 01-Sep-1965

## 2016-06-10 ENCOUNTER — Ambulatory Visit: Payer: BLUE CROSS/BLUE SHIELD | Admitting: Physical Therapy

## 2016-06-10 DIAGNOSIS — M25511 Pain in right shoulder: Secondary | ICD-10-CM

## 2016-06-10 DIAGNOSIS — M6281 Muscle weakness (generalized): Secondary | ICD-10-CM

## 2016-06-10 DIAGNOSIS — R29898 Other symptoms and signs involving the musculoskeletal system: Secondary | ICD-10-CM

## 2016-06-10 NOTE — Therapy (Addendum)
[facility] [address]  [place], [place], [address] Phone: [phone]   Fax:  [phone]  Physical Therapy Treatment  Patient Details  Name: Seth Stanley MRN: [id number] Date of Birth: 03/22/1965 Referring Provider: Dr. Hart Robinsons  Encounter Date: 06/10/2016      PT End of Session - 06/10/16 1155    Visit Number 16   Number of Visits 16   Date for PT Re-Evaluation 06/05/16   Authorization Type BCBS - VL: 23   PT Start Time 1155   PT Stop Time 5643   PT Time Calculation (min) 40 min   Activity Tolerance Patient tolerated treatment well   Behavior During Therapy [facility] for tasks assessed/performed      Past Medical History:  Diagnosis Date  . High cholesterol   . Thyroid disease     No past surgical history on file.  There were no vitals filed for this visit.      Subjective Assessment - 06/10/16 1158    Subjective Pt reporting only able to swim 64mbutterfly before fatigue sets in and if he tries to continue, notes soreness starts up. Otherwise tolerating nearly daily swimming for 1+ hr w/o pain. Pain noted when sittting at desk on last visit able to be resolved with postural correction.   Patient Stated Goals "be ready in 4 weeks to get back to the pool & surf trip in May"   Currently in Pain? No/denies            [facility] - [facility]      Assessment   Medical Diagnosis Strain & Sprain of R shoulder; OA of R shoulder   Referring Provider Dr. AHart Robinsons  Onset Date/Surgical Date 03/28/16   Next MD Visit 06/12/16     Observation/Other Assessments   Focus on Therapeutic Outcomes (FOTO)  Shoulder 74% (26% limitation)     AROM   Overall AROM  Within functional limits for tasks performed   Overall AROM Comments R shoulder WNL - pain free     Strength   Right Shoulder Flexion 5/5   Right Shoulder Extension 5/5   Right Shoulder ABduction 4+/5   Right Shoulder  Internal Rotation 4+/5   Right Shoulder External Rotation 5/5   Right Shoulder Horizontal ABduction 5/5   Right Shoulder Horizontal ADduction 5/5   Left Shoulder Flexion 5/5   Left Shoulder Extension 5/5   Left Shoulder ABduction 5/5   Left Shoulder Internal Rotation 5/5   Left Shoulder External Rotation 5/5   Left Shoulder Horizontal ABduction 5/5   Left Shoulder Horizontal ADduction 5/5                     [facility] Adult PT Treatment/Exercise - 06/10/16 0001      Shoulder Exercises: Prone   Flexion Both;10 reps   Flexion Limitations "superman" prone on orange (55 cm) Pball   Extension Both;10 reps   Extension Limitations "I" prone on orange (55 cm) Pball   External Rotation Both;10 reps   External Rotation Limitations "W" prone on orange (55 cm) Pball   Horizontal ABduction 1 Both;10 reps   Horizontal ABduction 1 Limitations "T" prone on orange (55 cm) Pball   Horizontal ABduction 2 Both;10 reps   Horizontal ABduction 2 Limitations "Y" prone on orange (55 cm) Pball     Shoulder Exercises: Standing   Horizontal ABduction Both;20 reps;Theraband   Theraband Level (Shoulder Horizontal ABduction) Level  3 (Green)   Horizontal ABduction Limitations standing with back along doorframe   ABduction Right;15 reps;Theraband;Strengthening   Theraband Level (Shoulder ABduction) Level 3 (Green)   Extension Right;20 reps;Theraband;Strengthening   Theraband Level (Shoulder Extension) Level 4 (Blue)   Extension Limitations from full flexion   Retraction Both;20 reps;Theraband;Strengthening   Theraband Level (Shoulder Retraction) Level 4 (Blue)   Retraction Limitations "W" row   Other Standing Exercises R shoulder flex/ext oscillation with blue TB in full flexion facing toward & away from door 2x30"                  PT Short Term Goals - 04/21/16 1113      PT SHORT TERM GOAL #1   Title Pt will be independent with initial HEP by 05/01/16.   Status Achieved            PT Long Term Goals - 06/10/16 1202      PT LONG TERM GOAL #1   Title Pt will be independent with advanced HEP by 06/05/16.   Status Achieved     PT LONG TERM GOAL #2   Title Pt will have R shoulder AROM WFL and without increased pain at end range by 06/05/16.    Status Achieved     PT LONG TERM GOAL #3   Title Pt will increase R UE strength >/=  to 4+/5 to improve overall UE function by 06/05/16.   Status Achieved     PT LONG TERM GOAL #4   Title Pt will be able to return to swimming regularly without an increase in R shoulder pain by 06/05/16.   Status Achieved               Plan - 06/10/16 1202    Clinical Impression Statement Pt has demonstrated excellent progress with PT with restoration of R shoulder ROM to >/= WNL painfree and strength 4+/5 to 5/5. Pt has been able to resume swimming almost daily for 1+ hrs w/o pain but notes more fatigue as he has resumed butterfly just recently. All goals met for this episode and will plan for transition to HEP, but will kept chart open for 30 days in the event that further issues arise as pt gets back into swimming butterfly more frequently.   Rehab Potential --   PT Treatment/Interventions Patient/family education;ADLs/Self Care Home Management;Electrical Stimulation;Cryotherapy;Moist Heat;Therapeutic activities;Therapeutic exercise;Neuromuscular re-education;Manual techniques;Dry needling;Taping;Vasopneumatic Device;Iontophoresis '4mg'$ /ml Dexamethasone   PT Next Visit Plan 30 day hold    Consulted and Agree with Plan of Care Patient      Patient will benefit from skilled therapeutic intervention in order to improve the following deficits and impairments:  Pain, Decreased activity tolerance, Decreased strength, Impaired flexibility, Impaired UE functional use, Postural dysfunction  Visit Diagnosis: Acute pain of right shoulder  Muscle weakness (generalized)  Upper extremity weakness     Problem List There are no active problems  to display for this patient.   Percival Spanish, PT, MPT 06/10/2016, 12:48 PM  [facility] [address]  [place], [place], [address] Phone: [phone]   Fax:  [phone]  Name: Seth Stanley MRN: [id number] Date of Birth: [birth year]   PHYSICAL THERAPY DISCHARGE SUMMARY  Visits from Start of Care: 16  Current functional level related to goals / functional outcomes:   Refer to above clinical impression. Pt has not needed to return in >30 days, therefore will proceed with discharge from PT for this episode.  Remaining deficits:   As above.   Education / Equipment:   HEP  Plan: Patient agrees to discharge.  Patient goals were met. Patient is being discharged due to meeting the stated rehab goals.  ?????    Percival Spanish, PT, MPT 07/15/16, 10:31 AM  [facility] [address]  [place], [place], [address] Phone: [phone]   Fax:  [phone]

## 2018-06-22 ENCOUNTER — Emergency Department (HOSPITAL_COMMUNITY): Payer: BLUE CROSS/BLUE SHIELD

## 2018-06-22 ENCOUNTER — Encounter (HOSPITAL_COMMUNITY): Payer: Self-pay | Admitting: Emergency Medicine

## 2018-06-22 ENCOUNTER — Other Ambulatory Visit: Payer: Self-pay

## 2018-06-22 ENCOUNTER — Emergency Department (HOSPITAL_COMMUNITY)
Admission: EM | Admit: 2018-06-22 | Discharge: 2018-06-22 | Disposition: A | Discharge status: Home / Self Care | Payer: BLUE CROSS/BLUE SHIELD | Attending: Emergency Medicine | Admitting: Emergency Medicine

## 2018-06-22 DIAGNOSIS — N23 Unspecified renal colic: Secondary | ICD-10-CM

## 2018-06-22 DIAGNOSIS — Z79899 Other long term (current) drug therapy: Secondary | ICD-10-CM | POA: Diagnosis not present

## 2018-06-22 DIAGNOSIS — R1032 Left lower quadrant pain: Secondary | ICD-10-CM | POA: Diagnosis present

## 2018-06-22 LAB — CBC WITH DIFFERENTIAL/PLATELET
Abs Immature Granulocytes: 0.03 10*3/uL (ref 0.00–0.07)
Basophils Absolute: 0 10*3/uL (ref 0.0–0.1)
Basophils Relative: 0 %
Eosinophils Absolute: 0 10*3/uL (ref 0.0–0.5)
Eosinophils Relative: 0 %
HCT: 43.1 % (ref 39.0–52.0)
Hemoglobin: 14.4 g/dL (ref 13.0–17.0)
Immature Granulocytes: 0 %
Lymphocytes Relative: 13 %
Lymphs Abs: 1.2 10*3/uL (ref 0.7–4.0)
MCH: 32.4 pg (ref 26.0–34.0)
MCHC: 33.4 g/dL (ref 30.0–36.0)
MCV: 97.1 fL (ref 80.0–100.0)
Monocytes Absolute: 0.5 10*3/uL (ref 0.1–1.0)
Monocytes Relative: 5 %
Neutro Abs: 7.5 10*3/uL (ref 1.7–7.7)
Neutrophils Relative %: 82 %
Platelets: 187 10*3/uL (ref 150–400)
RBC: 4.44 MIL/uL (ref 4.22–5.81)
RDW: 12.1 % (ref 11.5–15.5)
WBC: 9.2 10*3/uL (ref 4.0–10.5)
nRBC: 0 % (ref 0.0–0.2)

## 2018-06-22 LAB — COMPREHENSIVE METABOLIC PANEL
ALT: 26 U/L (ref 0–44)
AST: 25 U/L (ref 15–41)
Albumin: 4.3 g/dL (ref 3.5–5.0)
Alkaline Phosphatase: 54 U/L (ref 38–126)
Anion gap: 11 (ref 5–15)
BUN: 14 mg/dL (ref 6–20)
CO2: 23 mmol/L (ref 22–32)
Calcium: 8.8 mg/dL — ABNORMAL LOW (ref 8.9–10.3)
Chloride: 106 mmol/L (ref 98–111)
Creatinine, Ser: 1.01 mg/dL (ref 0.61–1.24)
GFR calc Af Amer: >60 mL/min (ref >60)
GFR calc non Af Amer: >60 mL/min (ref >60)
Glucose, Bld: 117 mg/dL — ABNORMAL HIGH (ref 70–99)
Potassium: 3.8 mmol/L (ref 3.5–5.1)
Sodium: 140 mmol/L (ref 135–145)
Total Bilirubin: 1.1 mg/dL (ref 0.3–1.2)
Total Protein: 6.5 g/dL (ref 6.5–8.1)

## 2018-06-22 LAB — URINALYSIS, ROUTINE W REFLEX MICROSCOPIC
Bacteria, UA: NONE SEEN
Bilirubin Urine: NEGATIVE
Glucose, UA: NEGATIVE mg/dL
Ketones, ur: 80 mg/dL — AB
Leukocytes,Ua: NEGATIVE
Nitrite: NEGATIVE
Protein, ur: 30 mg/dL — AB
RBC / HPF: >50 RBC/hpf — ABNORMAL HIGH (ref 0–5)
Specific Gravity, Urine: 1.027 (ref 1.005–1.030)
pH: 6 (ref 5.0–8.0)

## 2018-06-22 MED ORDER — HYDROMORPHONE HCL 1 MG/ML IJ SOLN
1.0000 mg | Freq: Once | INTRAMUSCULAR | Status: AC
  Administered 2018-06-22: 1 mg via INTRAVENOUS
  Filled 2018-06-22: qty 1

## 2018-06-22 MED ORDER — KETOROLAC TROMETHAMINE 30 MG/ML IJ SOLN
30.0000 mg | Freq: Once | INTRAMUSCULAR | Status: AC
  Administered 2018-06-22: 30 mg via INTRAVENOUS
  Filled 2018-06-22: qty 1

## 2018-06-22 MED ORDER — ONDANSETRON HCL 4 MG/2ML IJ SOLN
4.0000 mg | Freq: Once | INTRAMUSCULAR | Status: AC
  Administered 2018-06-22: 20:00:00 4 mg via INTRAVENOUS
  Filled 2018-06-22: qty 2

## 2018-06-22 NOTE — ED Notes (Signed)
Bed: WA14 Expected date:  Expected time:  Means of arrival:  Comments: EMS/flank pain 

## 2018-06-22 NOTE — ED Provider Notes (Signed)
[facility]-EMERGENCY DEPT Provider Note   CSN: [id number] Arrival date & time: 06/22/18  1918    History   Chief Complaint Chief Complaint  Patient presents with  . Flank Pain    HPI Debe CoderGuilherme Godbolt is a 53 y.o. male.     53 year old male with past medical history including hyperlipidemia and thyroid disease who presents with left flank pain.  Around 6 PM this evening, he had sudden onset of severe, intermittent left flank pain that radiates around to his left side.  Pain has been coming and going.  He has had associated nausea and vomiting.  No hematuria, dysuria, fevers, or cough/cold symptoms.  He reports very distant history of kidney stone approximately 15 years ago.  He received a total of 200 mcg of fentanyl by EMS, only mild relief of his symptoms.  The history is provided by the patient.  Flank Pain     Past Medical History:  Diagnosis Date  . High cholesterol   . Thyroid disease     There are no active problems to display for this patient.   History reviewed. No pertinent surgical history.      Home Medications    Prior to Admission medications   Medication Sig Start Date End Date Taking? Authorizing Provider  levothyroxine (SYNTHROID, LEVOTHROID) 150 MCG tablet Take 150 mcg by mouth daily before breakfast.   Yes [provider]  rosuvastatin (CRESTOR) 20 MG tablet Take 20 mg by mouth daily.   Yes [provider]  HYDROcodone-acetaminophen (NORCO/VICODIN) 5-325 MG per tablet Take 1 tablet by mouth every 6 (six) hours as needed for severe pain. Patient not taking: Reported on 06/14/2014 04/28/14   Fayrene Helperran, [place], PA-C  ibuprofen (ADVIL,MOTRIN) 800 MG tablet Take 1 tablet (800 mg total) by mouth 3 (three) times daily. Patient not taking: Reported on 06/14/2014 04/28/14   Fayrene Helperran, [place], PA-C  methocarbamol (ROBAXIN) 500 MG tablet Take 1 tablet (500 mg total) by mouth 2 (two) times daily. Patient not taking: Reported on  06/14/2014 04/28/14   Fayrene Helperran, [place], PA-C    Family History History reviewed. No pertinent family history.  Social History Social History   Tobacco Use  . Smoking status: Never Smoker  Substance Use Topics  . Alcohol use: No  . Drug use: Not on file     Allergies   Levothyroxine   Review of Systems Review of Systems  Genitourinary: Positive for flank pain.   All other systems reviewed and are negative except that which was mentioned in HPI   Physical Exam Updated Vital Signs BP 124/76   Pulse (!) 48   Temp 97.6 F (36.4 C) (Oral)   Resp 20   SpO2 100%   Physical Exam Vitals signs and nursing note reviewed.  Constitutional:      General: He is in acute distress.     Appearance: He is well-developed.     Comments: Moaning, in distress due to pain  HENT:     Head: Normocephalic and atraumatic.     Mouth/Throat:     Mouth: Mucous membranes are moist.  Eyes:     Conjunctiva/sclera: Conjunctivae normal.  Neck:     Musculoskeletal: Neck supple.  Cardiovascular:     Rate and Rhythm: Regular rhythm. Bradycardia present.     Heart sounds: Normal heart sounds. No murmur.  Pulmonary:     Effort: Pulmonary effort is normal.     Breath sounds: Normal breath sounds.  Abdominal:     General:  Bowel sounds are normal. There is no distension.     Palpations: Abdomen is soft.     Tenderness: There is no abdominal tenderness. There is left CVA tenderness.  Musculoskeletal:     Right lower leg: No edema.     Left lower leg: No edema.  Skin:    General: Skin is warm and dry.  Neurological:     Mental Status: He is alert and oriented to person, place, and time.     Comments: Fluent speech  Psychiatric:     Comments: distressed      ED Treatments / Results  Labs (all labs ordered are listed, but only abnormal results are displayed) Labs Reviewed  COMPREHENSIVE METABOLIC PANEL - Abnormal; Notable for the following components:      Result Value   Glucose, Bld 117 (*)     Calcium 8.8 (*)    All other components within normal limits  URINALYSIS, ROUTINE W REFLEX MICROSCOPIC - Abnormal; Notable for the following components:   APPearance HAZY (*)    Hgb urine dipstick LARGE (*)    Ketones, ur 80 (*)    Protein, ur 30 (*)    RBC / HPF >50 (*)    All other components within normal limits  CBC WITH DIFFERENTIAL/PLATELET    EKG None  Radiology Ct Renal Stone Study  Result Date: 06/22/2018 CLINICAL DATA:  53 year old male with left flank pain since 1800 hours. EXAM: CT ABDOMEN AND PELVIS WITHOUT CONTRAST TECHNIQUE: Multidetector CT imaging of the abdomen and pelvis was performed following the standard protocol without IV contrast. COMPARISON:  None. FINDINGS: Lower chest: Negative. No pericardial or pleural effusion. Hepatobiliary: Negative noncontrast liver and gallbladder. Pancreas: Negative. Spleen: Negative. Adrenals/Urinary Tract: Normal adrenal glands. Normal noncontrast right kidney and right ureter. No right nephrolithiasis. Subtle asymmetric prominence of the left renal pelvis and proximal left ureter but no overt left hydronephrosis. Trace asymmetric left perinephric stranding. The mid and distal left ureter are nondilated. The left ureterovesical junction appears normal. However, there is a dependent 2-3 millimeter calculus within the bladder at the midline on series 2, image 71. There is otherwise intermediate density fluid in the bladder which is largely decompressed. Superimposed punctate left upper pole nephrolithiasis on coronal image 94. Stomach/Bowel: Negative descending and rectosigmoid colon. Redundant sigmoid. Mildly redundant transverse colon. Negative transverse and right colon. Normal appendix on series 2, image 60. Flocculated material in the terminal ileum which otherwise appears negative. No dilated small bowel. Negative stomach. No free air, free fluid. Vascular/Lymphatic: Aortoiliac calcified atherosclerosis. Vascular patency is not evaluated  in the absence of IV contrast. No lymphadenopathy. Reproductive: Negative aside from prostate hypertrophy. Other: No pelvic free fluid. Musculoskeletal: Benign-appearing lucent area in the anterior L2 vertebral body. Benign bone island suspected in the right acetabulum on coronal image 71. Normal bone mineralization elsewhere. No acute or suspicious osseous lesion. IMPRESSION: 1. A 2-3 mm calculus layering within the bladder is likely a recently passed stone from the left side. Subtle asymmetric left perinephric and periureteral stranding but no overt hydronephrosis. Punctate left upper pole nephrolithiasis. 2. Otherwise largely negative non-contrast abdomen and pelvis. 3. Aortic Atherosclerosis (ICD10-I70.0). Electronically Signed   By: Odessa Fleming M.D.   On: 06/22/2018 20:39    Procedures Procedures (including critical care time)  Medications Ordered in ED Medications  HYDROmorphone (DILAUDID) injection 1 mg (1 mg Intravenous Given 06/22/18 1942)  ondansetron (ZOFRAN) injection 4 mg (4 mg Intravenous Given 06/22/18 1941)  ketorolac (TORADOL) 30 MG/ML injection  30 mg (30 mg Intravenous Given 06/22/18 2206)     Initial Impression / Assessment and Plan / ED Course  I have reviewed the triage vital signs and the nursing notes.  Pertinent labs & imaging results that were available during my care of the patient were reviewed by me and considered in my medical decision making (see chart for details).       In distress due to pain on arrival, stable vital signs and afebrile.  No focal abdominal tenderness.  Given sudden onset and location of pain, concern for renal stone, less likely pyelonephritis, diverticulitis, or bowel obstruction. UA w/ blood, no evidence of infection. CMP and CBC normal. CT abd/pelvis shows calculus in bladder w/ mild L perinephric and periureteral stranding suggestive of recently passed stone. On reassessment, he was well appearing and stated nausea and pain had resolved. I  discussed f/u with urology as needed, reviewed return precautions and he voiced understanding.  Final Clinical Impressions(s) / ED Diagnoses   Final diagnoses:  Ureteral colic    ED Discharge Orders    None       , Ambrose Finland, MD 06/22/18 2225

## 2018-06-22 NOTE — ED Notes (Signed)
Patient transported to CT 

## 2018-06-22 NOTE — ED Notes (Signed)
Patient transported from CT to room 13

## 2018-06-22 NOTE — ED Triage Notes (Signed)
Pt from home c/o left flank pain radiating to LL abd 10/10 no difficulty urinating no  blood seen in urine. Onset 1810 tonight  Pain slightly relieved by 50 mcg fentanyl x4 doses but has returned on arrival to ED. [phone] 1st daughter phone request call with status, 2nd daughter [phone].

## 2020-08-15 IMAGING — CT CT RENAL STONE PROTOCOL
2 of 4 series · 16 of 46 positions shown, 18 images · non-contrast
Comparison: None.

CLINICAL DATA: 53-year-old male with left flank pain since 2411
hours.

EXAM:
CT ABDOMEN AND PELVIS WITHOUT CONTRAST
TECHNIQUE: Multidetector CT imaging of the abdomen and pelvis was performed
following the standard protocol without IV contrast.

[Series 2: axial st · axial · 0.79mm/px · z∈[+1105,+1520]mm · 13 of 95 slices shown, 15 images]
[im 6/95  soft-tissue]
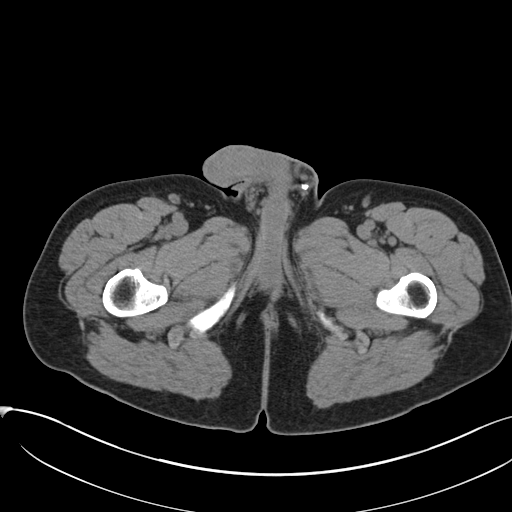
[im 6/95  bone]
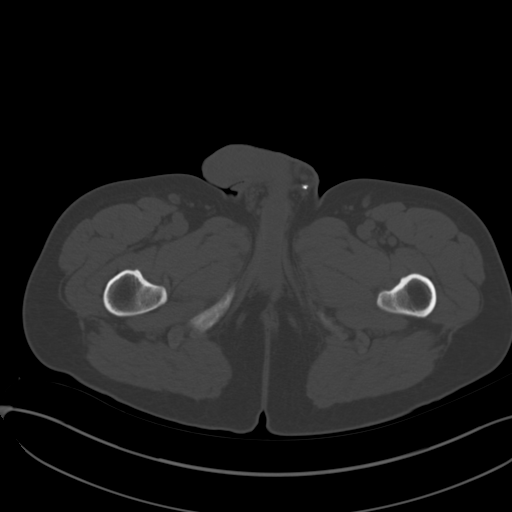
[im 12/95  soft-tissue]
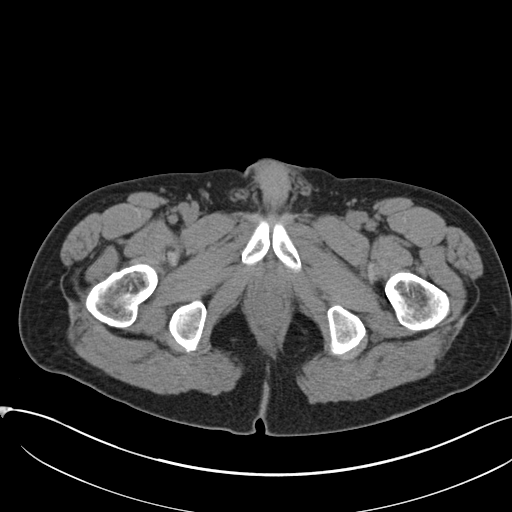
[im 23/95  soft-tissue]
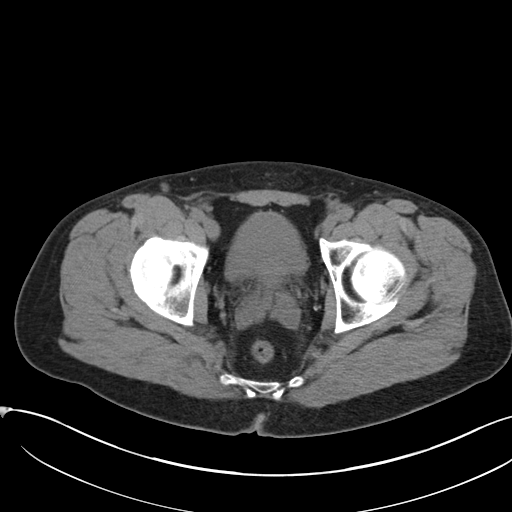
[im 28/95  soft-tissue]
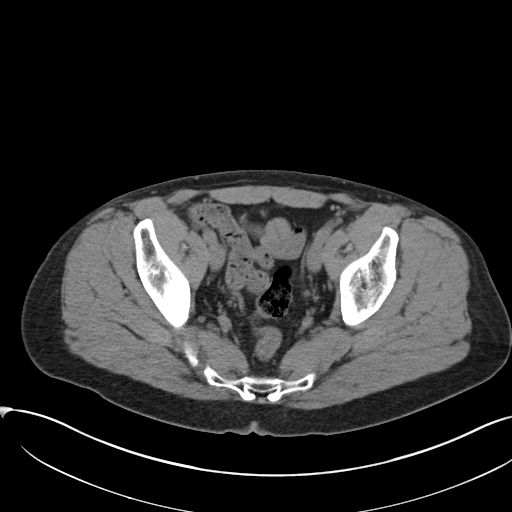
[im 34/95  soft-tissue]
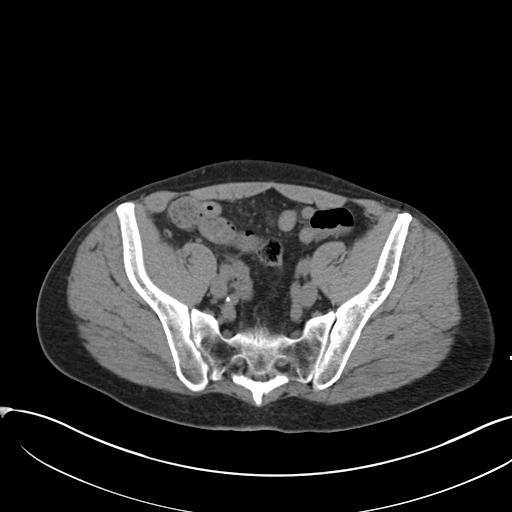
[im 39/95  soft-tissue]
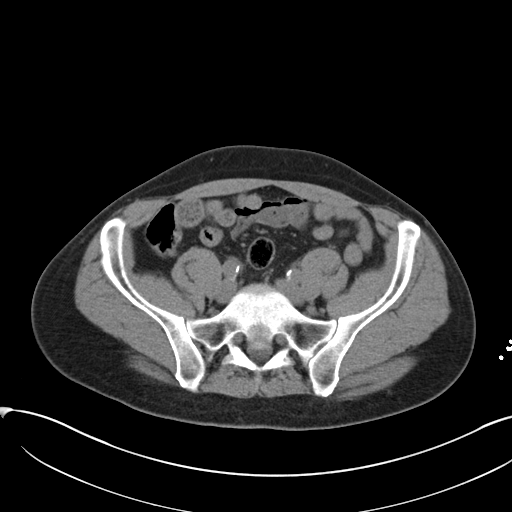
[im 50/95  soft-tissue]
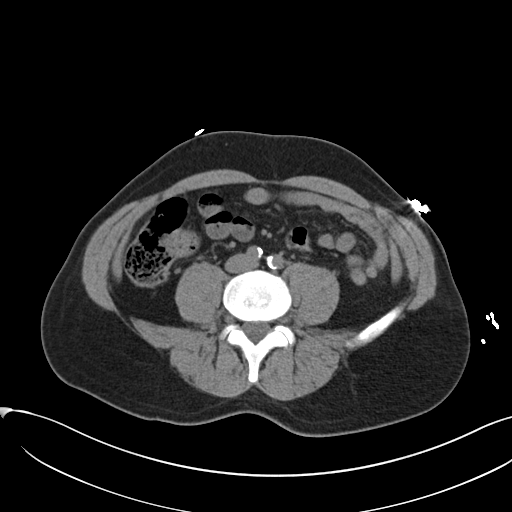
[im 56/95  soft-tissue]
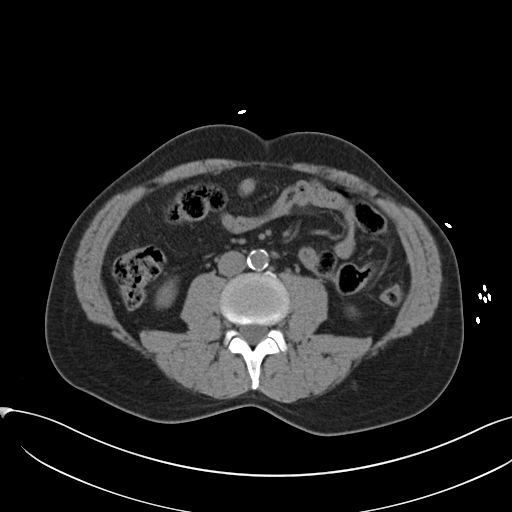
[im 61/95  soft-tissue]
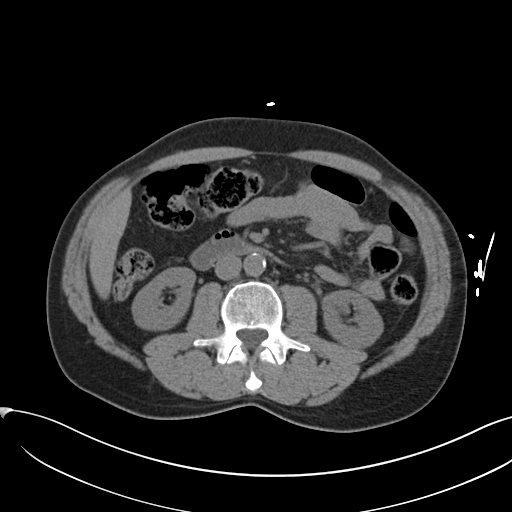
[im 61/95  bone]
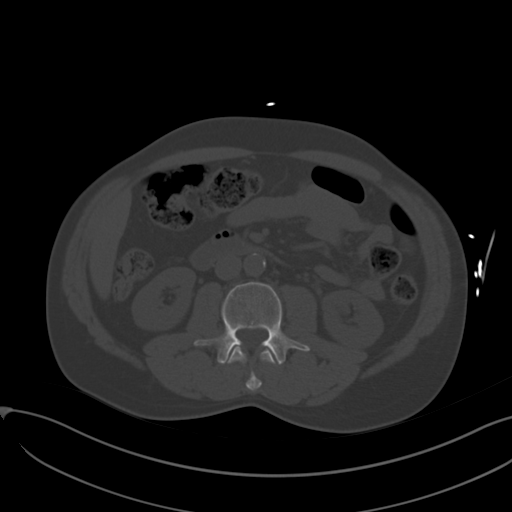
[im 67/95  soft-tissue]
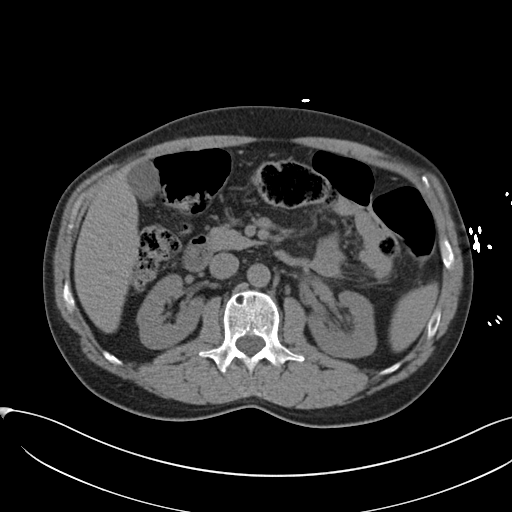
[im 72/95  soft-tissue]
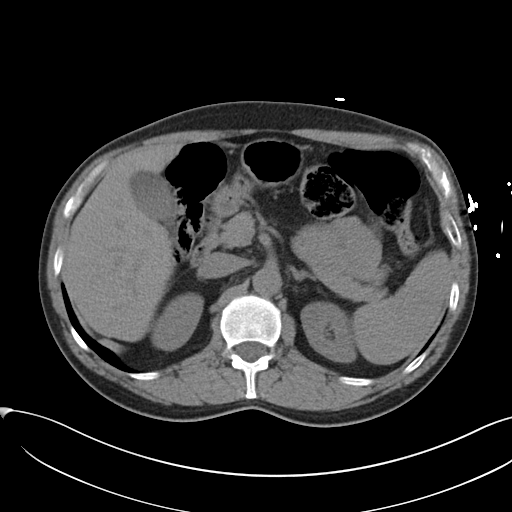
[im 83/95  soft-tissue]
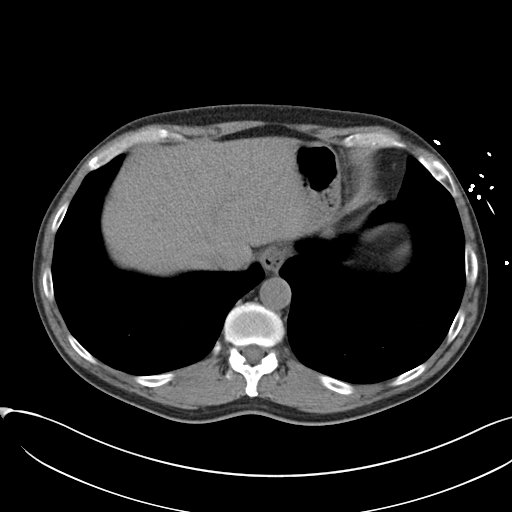
[im 89/95  soft-tissue]
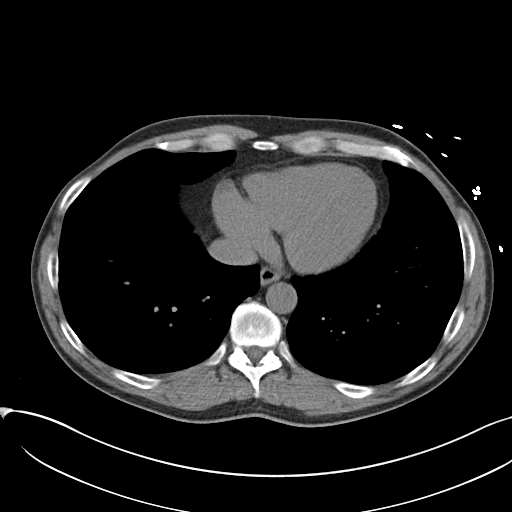

[Series 5: coronal · coronal · 0.80mm/px · 3 of 138 slices shown]
[im 46/138  soft-tissue]
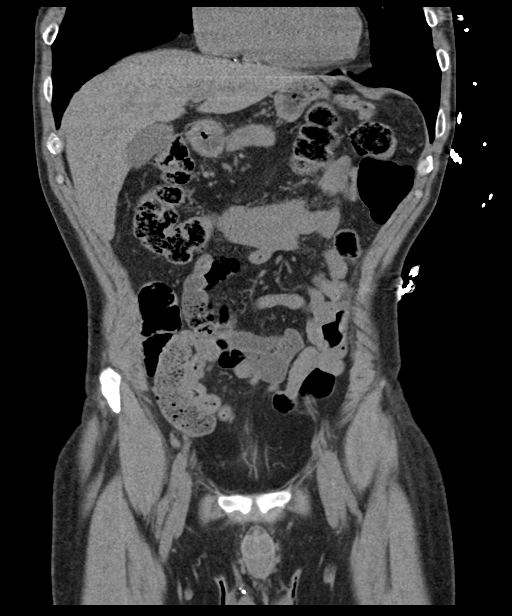
[im 61/138  soft-tissue]
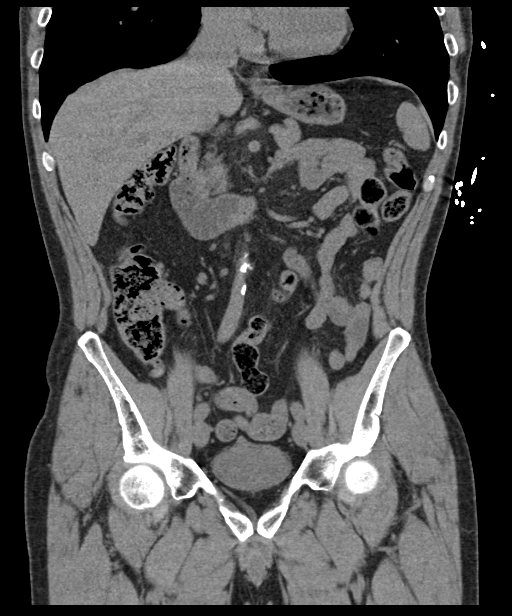
[im 77/138  soft-tissue]
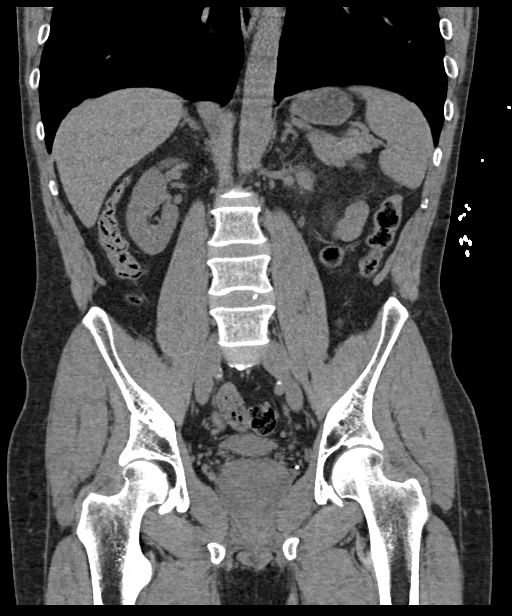

[16 of 46 positions shown; findings below may reference images not displayed]

FINDINGS: Lower chest: Negative. No pericardial or pleural effusion.

Hepatobiliary: Negative noncontrast liver and gallbladder.

Pancreas: Negative.

Spleen: Negative.

Adrenals/Urinary Tract: Normal adrenal glands.

Normal noncontrast right kidney and right ureter. No right
nephrolithiasis.

Subtle asymmetric prominence of the left renal pelvis and proximal
left ureter but no overt left hydronephrosis. Trace asymmetric left
perinephric stranding. The mid and distal left ureter are
nondilated. The left ureterovesical junction appears normal.
However, there is a dependent 2-3 millimeter calculus within the
bladder at the midline on series 2, image 71. There is otherwise
intermediate density fluid in the bladder which is largely
decompressed.

Superimposed punctate left upper pole nephrolithiasis on coronal
image 94.

Stomach/Bowel: Negative descending and rectosigmoid colon. Redundant
sigmoid. Mildly redundant transverse colon. Negative transverse and
right colon. Normal appendix on series 2, image 60.

Flocculated material in the terminal ileum which otherwise appears
negative. No dilated small bowel. Negative stomach. No free air,
free fluid.

Vascular/Lymphatic: Aortoiliac calcified atherosclerosis. Vascular
patency is not evaluated in the absence of IV contrast.

No lymphadenopathy.

Reproductive: Negative aside from prostate hypertrophy.

Other: No pelvic free fluid.

Musculoskeletal: Benign-appearing lucent area in the anterior L2
vertebral body. Benign bone island suspected in the right acetabulum
on coronal image 71. Normal bone mineralization elsewhere. No acute
or suspicious osseous lesion.
IMPRESSION: 1. A 2-3 mm calculus layering within the bladder is likely a
recently passed stone from the left side. Subtle asymmetric left
perinephric and periureteral stranding but no overt hydronephrosis.
Punctate left upper pole nephrolithiasis.
2. Otherwise largely negative non-contrast abdomen and pelvis.
3. Aortic Atherosclerosis ([id number]).

## 2022-07-22 NOTE — Progress Notes (Signed)
 Subjective Patient ID: Seth Stanley is a 57 y.o. male.  Chief Complaint  Patient presents with  . Skin complaint      HPI  Pt states that he has a tick on the back of his thigh but is unable to remove it because he is home by himself and has no one to help. Pt states that he is here today to get the tick removed.   Skin complaint  Primary Symptom: Skin complaint   Patient presents: bite   bite type: insect   bite type comment:  Tick Duration of current symptoms:  3 days Associated symptoms: itching   Associated symptoms: no myalgias, no chills, no nasal congestion, no appetite change, no fatigue, no fever, no headaches, no diaphoresis, no sore throat, no nausea, no diarrhea, no vomiting, no bleeding, no eye symptoms, no lesions, no nail changes, no neck pain, no pain, no purulent discharge, no numbness and no tingling   Treatments tried comment:  None  Location Skin: upper leg   Characteristics Skin: redness          Review of Systems  Constitutional:  Negative for appetite change, chills, diaphoresis, fatigue and fever.  HENT:  Negative for congestion and sore throat.   Gastrointestinal:  Negative for diarrhea, nausea and vomiting.  Musculoskeletal:  Negative for myalgias and neck pain.  Skin:  Positive for itching. Negative for nail changes.  Neurological:  Negative for numbness and headaches.    Social History   Tobacco Use  Smoking Status Never  Smokeless Tobacco Never   Past Medical History:  Diagnosis Date  . Disease of thyroid gland   . Hypercholesteremia    History reviewed. No pertinent surgical history. No family history on file.  Objective     Physical Exam Cardiovascular:     Rate and Rhythm: Normal rate and regular rhythm.     Heart sounds: S1 normal and S2 normal. Heart sounds not distant. No murmur heard.    No friction rub. No gallop. No S3 or S4 sounds.  Pulmonary:     Effort: Pulmonary effort is normal.     Breath sounds: Normal  breath sounds and air entry. No decreased breath sounds, wheezing, rhonchi or rales.  Skin:    General: Skin is warm and dry.     Findings: Abrasion present.          Comments: Tick removed with tweezers. After removal of tick a small, red  abrasion noted to the posterior of the right thigh.          Assessment HPI provided by Self  Based on today's visit:history and physical exam only, as no relevant testing deemed necessary patient's visit diagnosis is/includes  1. Abrasion of right leg, initial encounter   2. Tick bite with subsequent removal of tick    Patient has a history of chronic conditions and those listed in the visit diagnoses were reviewed today. They are currently stable on medications.  Treatment plan includes:  Plan Orders Placed: No orders of the defined types were placed in this encounter.  Medications ordered this visit   Signed Prescriptions Disp Refills  . bacitracin (BACITRAYCIN PLUS) 500 unit/gram ointment      Sig: Apply thin layer to affected area two to three times per day until healed. May apply 3rd dose as needed.    Current medication list and any new medications prescribed or recommended today were reviewed with the patient and specific instructions were provided Yes  Provider Recommendations  Take medications as prescribed    Follow up care instructions were provided and reviewed?with the  Patient. All questions were answered. Patient verbalized understanding of plan of care today.

## 2022-09-01 NOTE — Telephone Encounter (Signed)
  levothyroxine (SYNTHROID) 112 mcg tablet [[id number]]   doxazosin (CARDURA) 2 mg tablet [[id number]]   Need to refill

## 2022-09-01 NOTE — Telephone Encounter (Signed)
Done.  Rxs sent to pharmacy.
